# Patient Record
Sex: Female | Born: 1980 | Race: Asian | Hispanic: No | Smoking: Never smoker
Health system: Southern US, Community
[De-identification: ages and names within clinical notes are randomized; demographics above are authoritative.]

## PROBLEM LIST (undated history)

## (undated) ENCOUNTER — Inpatient Hospital Stay (HOSPITAL_COMMUNITY): Payer: Self-pay

## (undated) DIAGNOSIS — E039 Hypothyroidism, unspecified: Secondary | ICD-10-CM

## (undated) DIAGNOSIS — R011 Cardiac murmur, unspecified: Secondary | ICD-10-CM

## (undated) DIAGNOSIS — Z8632 Personal history of gestational diabetes: Secondary | ICD-10-CM

## (undated) HISTORY — PX: THYROIDECTOMY: SHX17

## (undated) HISTORY — DX: Personal history of gestational diabetes: Z86.32

## (undated) HISTORY — DX: Cardiac murmur, unspecified: R01.1

---
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
## 2002-01-31 ENCOUNTER — Encounter: Payer: Self-pay | Admitting: *Deleted

## 2002-01-31 ENCOUNTER — Inpatient Hospital Stay (HOSPITAL_COMMUNITY): Admission: AD | Admit: 2002-01-31 | Discharge: 2002-01-31 | Payer: Self-pay | Admitting: *Deleted

## 2002-02-02 ENCOUNTER — Inpatient Hospital Stay (HOSPITAL_COMMUNITY): Admission: AD | Admit: 2002-02-02 | Discharge: 2002-02-02 | Payer: Self-pay | Admitting: Obstetrics and Gynecology

## 2002-02-12 ENCOUNTER — Inpatient Hospital Stay (HOSPITAL_COMMUNITY): Admission: AD | Admit: 2002-02-12 | Discharge: 2002-02-12 | Payer: Self-pay | Admitting: Obstetrics and Gynecology

## 2007-03-22 ENCOUNTER — Emergency Department (HOSPITAL_COMMUNITY): Admission: EM | Admit: 2007-03-22 | Discharge: 2007-03-22 | Payer: Self-pay | Admitting: Emergency Medicine

## 2008-11-20 ENCOUNTER — Ambulatory Visit (HOSPITAL_COMMUNITY): Admission: RE | Admit: 2008-11-20 | Discharge: 2008-11-20 | Payer: Self-pay | Admitting: Family Medicine

## 2008-12-07 ENCOUNTER — Ambulatory Visit (HOSPITAL_COMMUNITY): Admission: RE | Admit: 2008-12-07 | Discharge: 2008-12-07 | Payer: Self-pay | Admitting: Family Medicine

## 2009-01-01 ENCOUNTER — Ambulatory Visit (HOSPITAL_COMMUNITY): Admission: RE | Admit: 2009-01-01 | Discharge: 2009-01-01 | Payer: Self-pay | Admitting: Family Medicine

## 2009-02-12 ENCOUNTER — Inpatient Hospital Stay (HOSPITAL_COMMUNITY): Admission: AD | Admit: 2009-02-12 | Discharge: 2009-02-14 | Payer: Self-pay | Admitting: Family Medicine

## 2009-02-12 ENCOUNTER — Ambulatory Visit: Payer: Self-pay | Admitting: Obstetrics & Gynecology

## 2009-02-12 ENCOUNTER — Ambulatory Visit: Payer: Self-pay | Admitting: Family

## 2011-03-16 LAB — CBC
HCT: 41.5 % (ref 36.0–46.0)
HCT: 41.7 % (ref 36.0–46.0)
Hemoglobin: 13.4 g/dL (ref 12.0–15.0)
Hemoglobin: 13.7 g/dL (ref 12.0–15.0)
MCHC: 32.3 g/dL (ref 30.0–36.0)
MCV: 82.1 fL (ref 78.0–100.0)
MCV: 82.4 fL (ref 78.0–100.0)
Platelets: 152 10*3/uL (ref 150–400)
RBC: 5.03 MIL/uL (ref 3.87–5.11)
RBC: 5.08 MIL/uL (ref 3.87–5.11)
RDW: 15.8 % — ABNORMAL HIGH (ref 11.5–15.5)
WBC: 10.5 10*3/uL (ref 4.0–10.5)
WBC: 19 10*3/uL — ABNORMAL HIGH (ref 4.0–10.5)

## 2011-04-21 NOTE — Op Note (Signed)
[facility]  Patient:    Theresa Owens, Theresa Owens                             MRN: [id number] Proc. Date: 01/24/01 Attending:  Evalee Mutton. Leeanne Mannan, M.D. CC:         [facility]   Operative Report  PREOPERATIVE DIAGNOSIS:  Right breast lump.  POSTOPERATIVE DIAGNOSIS:  Right breast lump.  PROCEDURE PERFORMED:  Excision biopsy of the right breast lump.  ANESTHESIA:  General laryngeal mask anesthesia.  SURGEON: Evalee Mutton. Leeanne Mannan, M.D.  ASSISTANT:  ______ .  DESCRIPTION OF PROCEDURE:  Patient was brought to the operating room and placed supine on the operating table.  General laryngeal mask anesthesia was given.  The right breast and the surrounding area were cleaned, prepped and draped in usual manner.  The lump was palpated, which appeared to be more than 3 cm in diameter in the right lower quadrant of the right breast.  We decided to make a circumareolar incision, which was marked in the right side of the circumference of right areolar.  The incision was made ______ half circle along the areolar.  The incision was deepened through the deeper tissue with electrocautery.  The mass was pushed under the incision and kept in position by the assistant and the incision was deepened through the subcutaneous tissue and the breast tissue until the lump was reached.  A little blunt dissection with blunt tip hemostat was done to expose the anterior substance of the lump on which a stay suture was taken using 3-0 silk.  Holding the stay suture, the lump was dissected by blunt and sharp dissection around until it was freed from all sides.  Finally, it was delivered out of the incision and the final connection with the breast tissue was divided with the help of electrocautery. After removing the breast lump completely it was removed from the field and sent fresh to pathology for study.  The wound was irrigated with normal saline.   The deeper cavity was  inspected for any oozing or bleeding. Bleeding points were cauterized.  The divided breast tissue was approximated using 4-0 Vicryl interrupted stitches.  Wound was irrigated once again.  About 10 cc of 0.25% Marcaine with epinephrine was infiltrated in and along the incision for postoperative pain control.  The skin was closed with 5-0 Monocryl subcuticular stitch.  Steri-Strips were applied, which were covered with sterile gauze and Tegaderm dressing.  The patient tolerated the procedure very well, which was ______ .  Estimated blood loss was minimal.  Patient was later extubated and transported to the recovery room in good and stable condition. DD:  01/24/01 TD:  01/24/01 Job: [id number]

## 2011-06-12 ENCOUNTER — Other Ambulatory Visit (HOSPITAL_COMMUNITY): Payer: Self-pay | Admitting: Internal Medicine

## 2011-06-12 DIAGNOSIS — C73 Malignant neoplasm of thyroid gland: Secondary | ICD-10-CM

## 2011-07-31 ENCOUNTER — Ambulatory Visit (HOSPITAL_COMMUNITY): Payer: Self-pay

## 2011-08-01 ENCOUNTER — Ambulatory Visit (HOSPITAL_COMMUNITY): Payer: Self-pay

## 2011-08-02 ENCOUNTER — Ambulatory Visit (HOSPITAL_COMMUNITY): Payer: Self-pay

## 2011-08-04 ENCOUNTER — Encounter (HOSPITAL_COMMUNITY): Payer: Self-pay

## 2014-09-21 LAB — OB RESULTS CONSOLE ABO/RH: RH TYPE: POSITIVE

## 2014-09-21 LAB — OB RESULTS CONSOLE RPR: RPR: NONREACTIVE

## 2014-09-21 LAB — OB RESULTS CONSOLE GC/CHLAMYDIA
CHLAMYDIA, DNA PROBE: NEGATIVE
Gonorrhea: NEGATIVE

## 2014-09-21 LAB — OB RESULTS CONSOLE HEPATITIS B SURFACE ANTIGEN: Hepatitis B Surface Ag: NEGATIVE

## 2014-09-21 LAB — OB RESULTS CONSOLE ANTIBODY SCREEN: ANTIBODY SCREEN: NEGATIVE

## 2014-09-21 LAB — OB RESULTS CONSOLE HIV ANTIBODY (ROUTINE TESTING): HIV: NONREACTIVE

## 2014-09-21 LAB — OB RESULTS CONSOLE RUBELLA ANTIBODY, IGM: Rubella: IMMUNE

## 2014-10-19 ENCOUNTER — Other Ambulatory Visit (HOSPITAL_COMMUNITY): Payer: Self-pay | Admitting: Nurse Practitioner

## 2014-10-19 DIAGNOSIS — Z3689 Encounter for other specified antenatal screening: Secondary | ICD-10-CM

## 2014-11-04 ENCOUNTER — Ambulatory Visit (HOSPITAL_COMMUNITY)
Admission: RE | Admit: 2014-11-04 | Discharge: 2014-11-04 | Disposition: A | Discharge status: Home / Self Care | Payer: Medicaid Other | Source: Ambulatory Visit | Attending: Nurse Practitioner | Admitting: Nurse Practitioner

## 2014-11-04 ENCOUNTER — Other Ambulatory Visit (HOSPITAL_COMMUNITY): Payer: Self-pay | Admitting: Nurse Practitioner

## 2014-11-04 DIAGNOSIS — Z3A19 19 weeks gestation of pregnancy: Secondary | ICD-10-CM | POA: Diagnosis not present

## 2014-11-04 DIAGNOSIS — Z36 Encounter for antenatal screening of mother: Secondary | ICD-10-CM | POA: Diagnosis not present

## 2014-11-04 DIAGNOSIS — O0932 Supervision of pregnancy with insufficient antenatal care, second trimester: Secondary | ICD-10-CM | POA: Insufficient documentation

## 2014-11-04 DIAGNOSIS — O26872 Cervical shortening, second trimester: Secondary | ICD-10-CM | POA: Diagnosis present

## 2014-11-04 DIAGNOSIS — O26879 Cervical shortening, unspecified trimester: Secondary | ICD-10-CM

## 2014-11-04 DIAGNOSIS — Z3689 Encounter for other specified antenatal screening: Secondary | ICD-10-CM

## 2014-11-04 DIAGNOSIS — Z1389 Encounter for screening for other disorder: Secondary | ICD-10-CM | POA: Insufficient documentation

## 2014-11-18 ENCOUNTER — Encounter (HOSPITAL_COMMUNITY): Payer: Self-pay

## 2014-11-18 ENCOUNTER — Ambulatory Visit (HOSPITAL_COMMUNITY)
Admission: RE | Admit: 2014-11-18 | Discharge: 2014-11-18 | Disposition: A | Discharge status: Home / Self Care | Payer: Medicaid Other | Source: Ambulatory Visit | Attending: Nurse Practitioner | Admitting: Nurse Practitioner

## 2014-11-18 DIAGNOSIS — O26872 Cervical shortening, second trimester: Secondary | ICD-10-CM | POA: Insufficient documentation

## 2014-11-18 DIAGNOSIS — Z3A21 21 weeks gestation of pregnancy: Secondary | ICD-10-CM | POA: Diagnosis not present

## 2014-11-18 DIAGNOSIS — O0932 Supervision of pregnancy with insufficient antenatal care, second trimester: Secondary | ICD-10-CM | POA: Insufficient documentation

## 2014-11-18 DIAGNOSIS — O99282 Endocrine, nutritional and metabolic diseases complicating pregnancy, second trimester: Secondary | ICD-10-CM | POA: Insufficient documentation

## 2014-11-18 DIAGNOSIS — O0930 Supervision of pregnancy with insufficient antenatal care, unspecified trimester: Secondary | ICD-10-CM | POA: Insufficient documentation

## 2014-11-18 DIAGNOSIS — E039 Hypothyroidism, unspecified: Secondary | ICD-10-CM | POA: Insufficient documentation

## 2014-11-18 DIAGNOSIS — O26879 Cervical shortening, unspecified trimester: Secondary | ICD-10-CM | POA: Insufficient documentation

## 2014-12-02 ENCOUNTER — Encounter (HOSPITAL_COMMUNITY): Payer: Self-pay | Admitting: *Deleted

## 2014-12-02 ENCOUNTER — Ambulatory Visit (HOSPITAL_COMMUNITY)
Admission: RE | Admit: 2014-12-02 | Discharge: 2014-12-02 | Disposition: A | Discharge status: Home / Self Care | Payer: Medicaid Other | Source: Ambulatory Visit | Attending: Nurse Practitioner | Admitting: Nurse Practitioner

## 2014-12-02 ENCOUNTER — Encounter (HOSPITAL_COMMUNITY): Payer: Self-pay

## 2014-12-02 ENCOUNTER — Inpatient Hospital Stay (HOSPITAL_COMMUNITY)
Admission: AD | Admit: 2014-12-02 | Discharge: 2014-12-15 | DRG: 781 | Disposition: A | Discharge status: Home / Self Care | Payer: Medicaid Other | Source: Ambulatory Visit | Attending: Obstetrics & Gynecology | Admitting: Obstetrics & Gynecology

## 2014-12-02 DIAGNOSIS — E039 Hypothyroidism, unspecified: Secondary | ICD-10-CM | POA: Insufficient documentation

## 2014-12-02 DIAGNOSIS — O329XX0 Maternal care for malpresentation of fetus, unspecified, not applicable or unspecified: Secondary | ICD-10-CM | POA: Diagnosis present

## 2014-12-02 DIAGNOSIS — Z3A25 25 weeks gestation of pregnancy: Secondary | ICD-10-CM | POA: Insufficient documentation

## 2014-12-02 DIAGNOSIS — O26872 Cervical shortening, second trimester: Secondary | ICD-10-CM

## 2014-12-02 DIAGNOSIS — O99282 Endocrine, nutritional and metabolic diseases complicating pregnancy, second trimester: Secondary | ICD-10-CM | POA: Insufficient documentation

## 2014-12-02 DIAGNOSIS — Z3A23 23 weeks gestation of pregnancy: Secondary | ICD-10-CM | POA: Diagnosis present

## 2014-12-02 DIAGNOSIS — E890 Postprocedural hypothyroidism: Secondary | ICD-10-CM | POA: Diagnosis present

## 2014-12-02 DIAGNOSIS — O99280 Endocrine, nutritional and metabolic diseases complicating pregnancy, unspecified trimester: Secondary | ICD-10-CM

## 2014-12-02 DIAGNOSIS — O3442 Maternal care for other abnormalities of cervix, second trimester: Secondary | ICD-10-CM

## 2014-12-02 DIAGNOSIS — O0932 Supervision of pregnancy with insufficient antenatal care, second trimester: Secondary | ICD-10-CM

## 2014-12-02 DIAGNOSIS — Z3A24 24 weeks gestation of pregnancy: Secondary | ICD-10-CM | POA: Diagnosis present

## 2014-12-02 DIAGNOSIS — O26879 Cervical shortening, unspecified trimester: Secondary | ICD-10-CM

## 2014-12-02 HISTORY — DX: Hypothyroidism, unspecified: E03.9

## 2014-12-02 LAB — TYPE AND SCREEN
ABO/RH(D): AB POS
Antibody Screen: NEGATIVE

## 2014-12-02 LAB — CBC
HEMATOCRIT: 39 % (ref 36.0–46.0)
HEMOGLOBIN: 13.2 g/dL (ref 12.0–15.0)
MCH: 27.5 pg (ref 26.0–34.0)
MCHC: 33.8 g/dL (ref 30.0–36.0)
MCV: 81.3 fL (ref 78.0–100.0)
Platelets: 181 10*3/uL (ref 150–400)
RBC: 4.8 MIL/uL (ref 3.87–5.11)
RDW: 13.4 % (ref 11.5–15.5)
WBC: 10.5 10*3/uL (ref 4.0–10.5)

## 2014-12-02 LAB — TSH: TSH: 1.025 u[IU]/mL (ref 0.350–4.500)

## 2014-12-02 LAB — ABO/RH: ABO/RH(D): AB POS

## 2014-12-02 MED ORDER — PRENATAL MULTIVITAMIN CH
1.0000 | ORAL_TABLET | Freq: Every day | ORAL | Status: DC
  Administered 2014-12-02 – 2014-12-15 (×14): 1 via ORAL
  Filled 2014-12-02 (×11): qty 1
  Filled 2014-12-02: qty 10
  Filled 2014-12-02 (×2): qty 1

## 2014-12-02 MED ORDER — ZOLPIDEM TARTRATE 5 MG PO TABS: 5.0000 mg | ORAL_TABLET | Freq: Every evening | ORAL | Status: DC | PRN

## 2014-12-02 MED ORDER — DOCUSATE SODIUM 100 MG PO CAPS
100.0000 mg | ORAL_CAPSULE | Freq: Every day | ORAL | Status: DC
  Administered 2014-12-02 – 2014-12-15 (×13): 100 mg via ORAL
  Filled 2014-12-02 (×13): qty 1

## 2014-12-02 MED ORDER — CALCIUM CARBONATE ANTACID 500 MG PO CHEW: 2.0000 | CHEWABLE_TABLET | ORAL | Status: DC | PRN

## 2014-12-02 MED ORDER — LEVOTHYROXINE SODIUM 100 MCG PO TABS
100.0000 ug | ORAL_TABLET | Freq: Every day | ORAL | Status: DC
Start: 2014-12-03 — End: 2014-12-15
  Administered 2014-12-03 – 2014-12-15 (×13): 100 ug via ORAL
  Filled 2014-12-02 (×14): qty 1

## 2014-12-02 MED ORDER — PROGESTERONE MICRONIZED 200 MG PO CAPS
200.0000 mg | ORAL_CAPSULE | Freq: Every day | ORAL | Status: DC
  Administered 2014-12-02 – 2014-12-05 (×4): 200 mg via VAGINAL

## 2014-12-02 MED ORDER — ACETAMINOPHEN 325 MG PO TABS: 650.0000 mg | ORAL_TABLET | ORAL | Status: DC | PRN

## 2014-12-02 MED ORDER — PROGESTERONE MICRONIZED 200 MG PO CAPS
200.0000 mg | ORAL_CAPSULE | Freq: Every day | ORAL | Status: DC
  Administered 2014-12-06 – 2014-12-14 (×9): 200 mg via VAGINAL
  Filled 2014-12-02 (×13): qty 1

## 2014-12-02 MED ORDER — BETAMETHASONE SOD PHOS & ACET 6 (3-3) MG/ML IJ SUSP
12.0000 mg | INTRAMUSCULAR | Status: AC
Start: 2014-12-02 — End: 2014-12-03
  Administered 2014-12-02 – 2014-12-03 (×2): 12 mg via INTRAMUSCULAR
  Filled 2014-12-02 (×2): qty 2

## 2014-12-02 NOTE — H&P (Signed)
Theresa Owens is a 33 y.o. female  [phone] at [redacted]w[redacted]d admitted for progressive cervical shortening  Maternal Medical History:  Reason for admission: Nausea. She was first noted to have short cx 2.2cm on 19 wk [place] and has been on vaginal progesterone since that time. Today cx measures 0.2cm with funneling.  Contractions: none  Fetal activity: Perceived fetal activity is normal.   Last perceived fetal movement was within the past hour.    Prenatal complications: See above. Also has hypothyroidism, on Synthroid. TFTs normal 09/21/14.  Prenatal Complications - Diabetes: none.    OB History    Gravida Para Term Preterm AB TAB SAB Ectopic Multiple Living   4 2 2  0 1 0 1 0 0 2     Past Medical History  Diagnosis Date  . Vaginal Pap smear, abnormal   . Hypothyroidism   . Cancer    Past Surgical History  Procedure Laterality Date  . Thyroidectomy N/A     2006   Family History: family history includes Hypertension in her mother. Social History:  reports that she has never smoked. She does not have any smokeless tobacco history on file. She reports that she does not drink alcohol or use illicit drugs.   Prenatal Transfer Tool  Maternal Diabetes: No Genetic Screening: Normal Maternal Ultrasounds/Referrals: Abnormal:  Findings:   Other: short cx Fetal Ultrasounds or other Referrals:  None Maternal Substance Abuse:  No Significant Maternal Medications:  Meds include: Progesterone Syntroid Significant Maternal Lab Results:  None Other Comments:  None  Review of Systems  Constitutional: Negative for fever and weight loss.  Respiratory: Negative for cough.   Cardiovascular: Negative for palpitations.  Gastrointestinal: Negative for heartburn, nausea, vomiting, abdominal pain, diarrhea and constipation.  Genitourinary: Negative for dysuria and flank pain.  Neurological: Negative for headaches.  Psychiatric/Behavioral: Negative for depression. The patient is not nervous/anxious.        Temperature 98.2 F (36.8 C), height 4\' 11"  (1.499 m), last menstrual period 06/18/2014. Maternal Exam:  Uterine Assessment: none  Abdomen: Estimated fetal weight is 57th%ile by [place] today.   Fetal presentation: breech     Fetal Exam Fetal Monitor Review: Mode: ultrasound.   Baseline rate: 135-140.  Variability: moderate (6-25 bpm).   Pattern: no decelerations and accelerations present.    Fetal State Assessment: Category I - tracings are normal.     Physical Exam  Nursing note and vitals reviewed. Constitutional: She is oriented to person, place, and time. She appears well-developed and well-nourished.  Short stature  HENT:  Head: Normocephalic.  Eyes: Pupils are equal, round, and reactive to light.  Neck: Normal range of motion. Neck supple. No thyromegaly present.  Cardiovascular: Normal rate, regular rhythm and normal heart sounds.   Respiratory: Effort normal and breath sounds normal.  GI: Soft. There is no tenderness.  Genitourinary:  Not examined  Musculoskeletal: Normal range of motion.  Neurological: She is alert and oriented to person, place, and time. She has normal reflexes.  Skin: Skin is warm and dry.  Psychiatric: She has a normal mood and affect. Her behavior is normal. Judgment and thought content normal.    Prenatal labs: ABO, Rh: AB/Positive/-- (10/19 0000) Antibody: Negative (10/19 0000) Rubella: Immune (10/19 0000) RPR: Nonreactive (10/19 0000)  HBsAg: Negative (10/19 0000)  HIV: Non-reactive (10/19 0000)  GBS:     Assessment/Plan: 33 yo Asian female [id number] at [redacted]w[redacted]d  Cervical shortening>BMZ, prometrium, NICU consult Hypothyroism> continue Synthroid  POE,Theresa Owens 12/02/2014, 12:59 PM

## 2014-12-03 NOTE — Progress Notes (Signed)
Patient ID: Theresa Owens, female   DOB: [birth year], 33 y.o.   MRN: [id number] [place] COMPREHENSIVE PROGRESS NOTE  Theresa Owens is a 33 y.o. [id number] at [redacted]w[redacted]d  who is admitted for cervical insufficiency.   Fetal presentation is breech. Length of Stay:  1  Days  Subjective: Pt denies pain or ctx Patient reports good fetal movement.  She reports no bleeding and no loss of fluid per vagina.  Vitals:  Blood pressure 99/56, pulse 84, temperature 98.2 F (36.8 C), temperature source Oral, resp. rate 16, height 4\' 11"  (1.499 m), weight 129 lb 4 oz (58.627 kg), last menstrual period 06/18/2014. Physical Examination: General appearance - alert, well appearing, and in no distress Abdomen - soft, nontender, nondistended, no masses or organomegaly gravid Extremities - no pedal edema noted Cervical Exam: Not evaluated. Membranes:intact  Fetal Monitoring:  Baseline: 120's bpm, Variability: Fair (1-6 bpm), Accelerations: Non-reactive but appropriate for gestational age and Decelerations: Absent  Labs:  Results for orders placed or performed during the hospital encounter of 12/02/14 (from the past 24 hour(s))  CBC on admission   Collection Time: 12/02/14  1:45 PM  Result Value Ref Range   WBC 10.5 4.0 - 10.5 K/uL   RBC 4.80 3.87 - 5.11 MIL/uL   Hemoglobin 13.2 12.0 - 15.0 g/dL   HCT 39.0 36.0 - 46.0 %   MCV 81.3 78.0 - 100.0 fL   MCH 27.5 26.0 - 34.0 pg   MCHC 33.8 30.0 - 36.0 g/dL   RDW 13.4 11.5 - 15.5 %   Platelets 181 150 - 400 K/uL  TSH   Collection Time: 12/02/14  1:45 PM  Result Value Ref Range   TSH 1.025 0.350 - 4.500 uIU/mL  Type and screen   Collection Time: 12/02/14  1:45 PM  Result Value Ref Range   ABO/RH(D) AB POS    Antibody Screen NEG    Sample Expiration 12/05/2014   ABO/Rh   Collection Time: 12/02/14  1:45 PM  Result Value Ref Range   ABO/RH(D) AB POS     Imaging Studies:    12/02/2014 CL 0.2cm EFW 57th%ile; breech   Medications:  Scheduled .  betamethasone acetate-betamethasone sodium phosphate  12 mg Intramuscular Q24H  . docusate sodium  100 mg Oral Daily  . levothyroxine  100 mcg Oral QAC breakfast  . prenatal multivitamin  1 tablet Oral Q1200  . progesterone  200 mg Vaginal QHS  . progesterone  200 mg Vaginal QHS   I have reviewed the patient's current medications.  ASSESSMENT: Patient Active Problem List   Diagnosis Date Noted  . Cervical shortening affecting pregnancy in second trimester 12/02/2014  . [redacted] weeks gestation of pregnancy   . Hypothyroid in pregnancy, antepartum   . Insufficient prenatal care   . Short cervix affecting pregnancy   . [redacted] weeks gestation of pregnancy   . [redacted] weeks gestation of pregnancy   . Encounter for fetal anatomic survey   . Encounter for routine screening for malformation using ultrasonics   . Short cervix in second trimester, antepartum     PLAN: Keep Prometrium 2nd dose of BMZ due today Watch for s/sx of progressive cervical change Continue routine antenatal care.   HARRAWAY-SMITH, Renner Sebald 12/03/2014,7:15 AM

## 2014-12-03 NOTE — Consult Note (Signed)
[place] 12/03/2014    11:57 PM  Neonatal Medicine Consultation         Theresa Owens          MRN:  [id number]  I was called at the request of the patient's obstetrician (Dr. Gala Romney) to speak to this patient due to potential preterm delivery.  The patient's prenatal course includes shortened cervix.  She is 24 weeks currently.  She is admitted to antenatal unit, and is receiving treatment that includes betamethasone course.  The baby is female.  I reviewed expectations for a baby born prematurely, including survival, length of stay, morbidities such as respiratory distress, IVH, infection, feeding intolerance, retinopathy.  I described how we provide respiratory and feeding support.  Mom plans to breast feed, which I encouraged as best for the baby (with supplementations for needed calories).  I let mom know that the baby's outlook generally improves the longer she remains undelivered.  I spent 20 minutes reviewing the record, speaking to the patient, and entering appropriate documentation.  More than 50% of the time was spent face to face with patient.   _____________________ Electronically Signed By: Roosevelt Locks, MD Neonatologist

## 2014-12-04 ENCOUNTER — Encounter (HOSPITAL_COMMUNITY): Payer: Self-pay

## 2014-12-04 NOTE — L&D Delivery Note (Signed)
Patient is 34 y.o. 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  APGAR: 9, 9; weight 7 lb 12.5 oz (3530 g).   Placenta status: Intact, Spontaneous.  Cord: 3 vessels with the following complications: None.  Anesthesia:  none Episiotomy:  none Lacerations:  2nd degree Suture Repair: 3.0 vicryl rapide Est. Blood Loss (mL):  139mL  Mom to postpartum.  Baby to Couplet care / Skin to Skin.  Theresa Owens ROCIO 03/25/2015, 2:48 PM

## 2014-12-04 NOTE — Progress Notes (Signed)
Patient ID: Theresa Owens, female   DOB: 09-26-1981, 34 y.o.   MRN: [id number] [place] COMPREHENSIVE PROGRESS NOTE  Theresa Owens is a 34 y.o. [id number] at [redacted]w[redacted]d  who is admitted for cervical insufficiency.   Fetal presentation is breech. Length of Stay:  2  Days  Subjective: Pt denies pain or ctx Patient reports good fetal movement.  She reports no bleeding and no loss of fluid per vagina.  Vitals:  Blood pressure 104/50, pulse 75, temperature 98.2 F (36.8 C), temperature source Oral, resp. rate 18, height 4\' 11"  (1.499 m), weight 129 lb 4 oz (58.627 kg), last menstrual period 06/18/2014, SpO2 98 %. Physical Examination: General appearance - alert, well appearing, and in no distress Abdomen - soft, nontender, gravid Extremities - no pedal edema noted Cervical Exam: Not evaluated. Membranes:intact  Fetal Monitoring:  Baseline: 150 bpm, Variability: Fair (1-6 bpm), Accelerations: Non-reactive but appropriate for gestational age, Decelerations: Absent and Toco: no contractions  Labs:  No results found for this or any previous visit (from the past 24 hour(s)).  Imaging Studies:    12/02/2014 CL 0.2cm EFW 57th%ile; breech   Medications:  Scheduled . docusate sodium  100 mg Oral Daily  . levothyroxine  100 mcg Oral QAC breakfast  . prenatal multivitamin  1 tablet Oral Q1200  . progesterone  200 mg Vaginal QHS  . progesterone  200 mg Vaginal QHS   I have reviewed the patient's current medications.  ASSESSMENT: Patient Active Problem List   Diagnosis Date Noted  . Cervical shortening affecting pregnancy in second trimester 12/02/2014  . [redacted] weeks gestation of pregnancy   . Hypothyroid in pregnancy, antepartum   . Insufficient prenatal care   . Short cervix affecting pregnancy   . [redacted] weeks gestation of pregnancy   . [redacted] weeks gestation of pregnancy   . Encounter for fetal anatomic survey   . Encounter for routine screening for malformation using ultrasonics   . Short  cervix in second trimester, antepartum     PLAN: Continue Prometrium and in house observation Watch for s/sx of preterm labor Will consult with MFM regarding length of stay Continue routine antenatal care.   Theresa Owens 12/04/2014,7:22 AM

## 2014-12-05 NOTE — Progress Notes (Signed)
Patient ID: Theresa Owens, female   DOB: 1981-07-12, 34 y.o.   MRN: [id number] [place]) NOTE  Theresa Owens is a 34 y.o. [id number] at [redacted]w[redacted]d  who is admitted for short cervix, 2 mm thick with funneling.   Fetal presentation is BREECH Length of Stay:  3  Days  Subjective: Pt stable without contractions Patient reports the fetal movement as active. Patient reports uterine contraction  activity as none. Patient reports  vaginal bleeding as none. Patient describes fluid per vagina as None.  Vitals:  Blood pressure 104/56, pulse 77, temperature 98.5 F (36.9 C), temperature source Oral, resp. rate 18, height 4\' 11"  (1.499 m), weight 58.627 kg (129 lb 4 oz), last menstrual period 06/18/2014, SpO2 98 %. Physical Examination:  General appearance - alert, well appearing, and in no distress Heart - normal rate and regular rhythm Abdomen - soft, nontender, nondistended Fundal Height:  size equals dates Cervical Exam: not done and fetal presentation is breech. Extremities: extremities normal, atraumatic, no cyanosis or edema and Homans sign is negative, no sign of DVT with DTRs 2+ bilaterally Membranes:intact  Fetal Monitoring:  To be done this a.m.  Labs:  No results found for this or any previous visit (from the past 24 hour(s)).  Imaging Studies:     Currently EPIC will not allow sonographic studies to automatically populate into notes.  In the meantime, copy and paste results into note or free text.  Medications:  Scheduled . docusate sodium  100 mg Oral Daily  . levothyroxine  100 mcg Oral QAC breakfast  . prenatal multivitamin  1 tablet Oral Q1200  . progesterone  200 mg Vaginal QHS  . progesterone  200 mg Vaginal QHS   I have reviewed the patient's current medications.  ASSESSMENT: Patient Active Problem List   Diagnosis Date Noted  . Cervical shortening affecting pregnancy in second trimester 12/02/2014  . [redacted] weeks gestation of pregnancy   . Hypothyroid  in pregnancy, antepartum   . Insufficient prenatal care   . Short cervix affecting pregnancy   . [redacted] weeks gestation of pregnancy   . [redacted] weeks gestation of pregnancy   . Encounter for fetal anatomic survey   . Encounter for routine screening for malformation using ultrasonics   . Short cervix in second trimester, antepartum     PLAN:Continue Prometrium and in house observation Watch for s/sx of preterm labor Will consult with MFM regarding length of stay Continue routine antenatal care.     Azya Barbero V 12/05/2014,7:25 AM

## 2014-12-06 NOTE — Progress Notes (Signed)
Patient ID: Theresa Owens, female   DOB: June 15, 1981, 34 y.o.   MRN: [id number] [place]) NOTE  Theresa Owens is a 34 y.o. [id number] at [redacted]w[redacted]d  who is admitted for short cervix, 2 mm thick with funneling.   Fetal presentation is BREECH Length of Stay:  4  Days  Subjective: Pt stable without contractions Patient reports the fetal movement as active. Patient reports uterine contraction  activity as none. Patient reports  vaginal bleeding as none. Patient describes fluid per vagina as None.  Vitals:  Blood pressure 103/56, pulse 75, temperature 98.2 F (36.8 C), temperature source Oral, resp. rate 18, height 4\' 11"  (1.499 m), weight 129 lb 4 oz (58.627 kg), last menstrual period 06/18/2014, SpO2 98 %. Physical Examination:  General appearance - alert, well appearing, and in no distress Heart - normal rate and regular rhythm Abdomen - soft, nontender, gravid Fundal Height:  size equals dates Cervical Exam: not done and fetal presentation is breech. Extremities: extremities normal, atraumatic, no cyanosis or edema and Homans sign is negative, no sign of DVT with DTRs 2+ bilaterally Membranes:intact  Fetal Monitoring:  Baseline 145, mod variability, no decelerations, non-reactive but appropriate for gestational age Toco: no contractions  Labs:  Results for orders placed or performed during the hospital encounter of 12/02/14 (from the past 24 hour(s))  Type and screen   Collection Time: 12/05/14  1:22 PM  Result Value Ref Range   ABO/RH(D) AB POS    Antibody Screen NEG    Sample Expiration 12/08/2014     Imaging Studies:     Currently EPIC will not allow sonographic studies to automatically populate into notes.  In the meantime, copy and paste results into note or free text.  Medications:  Scheduled . docusate sodium  100 mg Oral Daily  . levothyroxine  100 mcg Oral QAC breakfast  . prenatal multivitamin  1 tablet Oral Q1200  . progesterone  200 mg Vaginal QHS    I have reviewed the patient's current medications.  ASSESSMENT: Patient Active Problem List   Diagnosis Date Noted  . Cervical shortening affecting pregnancy in second trimester 12/02/2014  . [redacted] weeks gestation of pregnancy   . Hypothyroid in pregnancy, antepartum   . Insufficient prenatal care   . Short cervix affecting pregnancy   . [redacted] weeks gestation of pregnancy   . [redacted] weeks gestation of pregnancy   . Encounter for fetal anatomic survey   . Encounter for routine screening for malformation using ultrasonics   . Short cervix in second trimester, antepartum     PLAN: Continue Prometrium and in house observation Watch for s/sx of preterm labor Will consult with MFM regarding length of stay Continue routine antenatal care.     Theresa Owens 12/06/2014,6:59 AM

## 2014-12-07 ENCOUNTER — Inpatient Hospital Stay (HOSPITAL_COMMUNITY): Payer: Medicaid Other

## 2014-12-07 DIAGNOSIS — Z3A24 24 weeks gestation of pregnancy: Secondary | ICD-10-CM | POA: Diagnosis present

## 2014-12-07 NOTE — Progress Notes (Signed)
Ur chart review completed.  

## 2014-12-07 NOTE — Progress Notes (Signed)
Patient ID: Theresa Owens, female   DOB: June 10, 1981, 34 y.o.   MRN: [id number] [place] COMPREHENSIVE PROGRESS NOTE  Theresa Owens is a 34 y.o. [id number] at [redacted]w[redacted]d  who is admitted for shortened cervix.   Fetal presentation is unsure. Length of Stay:  5  Days  Subjective: Pt with no complaints.  +FM, NO VB, No ctx, No LOF   Vitals:  Blood pressure 110/63, pulse 79, temperature 98 F (36.7 C), temperature source Oral, resp. rate 20, height 4\' 11"  (1.499 m), weight 129 lb 4 oz (58.627 kg), last menstrual period 06/18/2014, SpO2 98 %. Physical Examination: General appearance - alert, well appearing, and in no distress Abdomen - soft, nontender, nondistended, no masses or organomegaly gravid Extremities - peripheral pulses normal, no pedal edema, no clubbing or cyanosis, no pedal edema noted Cervical Exam: Not evaluated.  Membranes:intact  Fetal Monitoring:  Baseline: 140-150's bpm, Variability: Good {> 6 bpm), Accelerations: Non-reactive but appropriate for gestational age, Decelerations: Absent and TOCO: no ctx  Labs:  No results found for this or any previous visit (from the past 24 hour(s)).  Imaging Studies:    sono pending for today to recheck cervical length.     Medications:  Scheduled . docusate sodium  100 mg Oral Daily  . levothyroxine  100 mcg Oral QAC breakfast  . prenatal multivitamin  1 tablet Oral Q1200  . progesterone  200 mg Vaginal QHS   I have reviewed the patient's current medications.  ASSESSMENT: Patient Active Problem List   Diagnosis Date Noted  . Cervical shortening affecting pregnancy in second trimester 12/02/2014  . [redacted] weeks gestation of pregnancy   . Hypothyroid in pregnancy, antepartum   . Insufficient prenatal care   . Short cervix affecting pregnancy   . [redacted] weeks gestation of pregnancy   . [redacted] weeks gestation of pregnancy   . Encounter for fetal anatomic survey   . Encounter for routine screening for malformation using ultrasonics   .  Short cervix in second trimester, antepartum     PLAN: sono today to recheck cervical length and determine discharge planning Continue routine antenatal care.   HARRAWAY-SMITH, Edna Grover 12/07/2014,7:21 AM

## 2014-12-08 ENCOUNTER — Inpatient Hospital Stay (HOSPITAL_COMMUNITY): Payer: Medicaid Other

## 2014-12-08 ENCOUNTER — Encounter (HOSPITAL_COMMUNITY): Payer: Medicaid Other

## 2014-12-08 LAB — TYPE AND SCREEN
ABO/RH(D): AB POS
Antibody Screen: NEGATIVE

## 2014-12-08 NOTE — Progress Notes (Signed)
[place] CONSULT  Patient Name: Theresa Owens Medical Record Number: [id number] Date of Birth: 16-Sep-1981 Requesting Physician Name: Guss Bunde, MD Date of Service: 12/08/2014  Chief Complaint Shortened cervix  History of Present Illness Theresa Owens was seen in consultation today secondary to a shortened cervix (3.5 mm) at the request of Dr. Harolyn Rutherford. The patient is a 34 y.o. [id number],SK [redacted]w[redacted]d with an EDD of 03/25/2015, by Last Menstrual Period dating method. Theresa Owens was noted to have a short cervix on 12/30. She has since had a course of betamethasone and has remained stable. She denies any contractions, loss of fluid, vaginal bleeding. Reports fetal movement.  Review of Systems Pertinent items are noted in HPI.  Patient History OB History  Gravida Para Term Preterm AB SAB TAB Ectopic Multiple Living  4 2 2  0 1 1 0 0 0 2    # Outcome Date GA Lbr Len/2nd Weight Sex Delivery Anes PTL Lv  4 Current           3 SAB           2 Term           1 Term               Past Medical History  Diagnosis Date  . Vaginal Pap smear, abnormal   . Hypothyroidism   . Cancer     Past Surgical History  Procedure Laterality Date  . Thyroidectomy N/A     2006    History   Social History  . Marital Status: Single    Spouse Name: N/A    Number of Children: N/A  . Years of Education: N/A   Social History Main Topics  . Smoking status: Never Smoker   . Smokeless tobacco: None  . Alcohol Use: No  . Drug Use: No  . Sexual Activity: Yes   Other Topics Concern  . None   Social History Narrative    Family History  Problem Relation Age of Onset  . Hypertension Mother    In addition, the patient has no family history of mental retardation, birth defects, or genetic diseases.  Physical Examination Filed  Vitals:   12/08/14 1107  BP: 116/71  Pulse: 106  Temp: 98.4 F (36.9 C)  Resp: 18   General appearance - alert, well appearing, and in no distress SSE: Cervix visually closed. No membranes noted coming from os.  SVE: Cervix closed. Cup pessary placed without difficulty with cervix seated in the smaller hole and posterior portion of the pessary seated in the posterior fornix.  Assessment and Recommendations 1. Shortened cervix at [redacted]w[redacted]d- Theresa Owens was counseled that she is past the gestation where cerclage would be an option but that in addition to the prometrium she is currently receiving a pessary would be an option. She was counseled that pessaries have been studied and have shown promise in the setting of short cervices but that at this time there is limited evidence on their efficacy.   The pessary should be removed if there is concern for preterm labor or rupture of membranes. Otherwise the pessary should remain in place until [redacted] weeks gestation.  I have relayed the above to Dr. Harolyn Rutherford and am happy to see Theresa Owens again should further concerns arise. Thank you for allowing me to participate in her care.    Cheryle Dark, MD           60 minutes was spent with the patient, >50% of  which was counseling on the above.

## 2014-12-08 NOTE — Progress Notes (Signed)
Patient ID: Theresa Owens, female   DOB: 18-Apr-1981, 34 y.o.   MRN: [id number] [place] COMPREHENSIVE PROGRESS NOTE  Rosaura Bolon is a 34 y.o. [id number] at [redacted]w[redacted]d  Who was admitted for shortened cervix.    Fetal presentation is breech as of 12/07/14 ultrasound; also showed 3.5 mm cervical length.  Length of Stay:  6  Days  Subjective: Patient with no complaints.  +FM, No VB, No ctx, No LOF  Vitals:  Blood pressure 108/66, pulse 87, temperature 98.1 F (36.7 C), temperature source Oral, resp. rate 18, height 4\' 11"  (1.499 m), weight 129 lb 4 oz (58.627 kg), last menstrual period 06/18/2014, SpO2 98 %. Physical Examination: General appearance - alert, well appearing, and in no distress Abdomen - soft, gravid, nontender, nondistended Extremities - peripheral pulses normal, no pedal edema, no clubbing or cyanosis, no pedal edema noted Cervical Exam - Not evaluated.  Membranes - intact  Fetal Monitoring:  Baseline: 140-150's bpm, Variability: Good {> 6 bpm), Accelerations: Non-reactive but appropriate for gestational age, Decelerations: Absent and TOCO: no ctx   Medications:  Scheduled . docusate sodium  100 mg Oral Daily  . levothyroxine  100 mcg Oral QAC breakfast  . prenatal multivitamin  1 tablet Oral Q1200  . progesterone  200 mg Vaginal QHS   I have reviewed the patient's current medications.  ASSESSMENT: Patient Active Problem List   Diagnosis Date Noted  . [redacted] weeks gestation of pregnancy   . Cervical shortening affecting pregnancy in second trimester 12/02/2014  . Hypothyroid in pregnancy, antepartum   . Insufficient prenatal care     PLAN: MFM consulted regarding evaluation for possible pessary placement Continue inpatient surveillance for now and routine antenatal care  Osborne Oman, MD 12/08/2014,10:37 AM

## 2014-12-08 NOTE — Consult Note (Signed)
[facility] CONSULT  Patient Name: Theresa Owens Medical Record Number:  [id number] Date of Birth: [birth year] Requesting Physician Name:  Guss Bunde, MD Date of Service: 12/08/2014  Chief Complaint Shortened cervix  History of Present Illness Victorious Cosio was seen in consultation today secondary to a shortened cervix (3.5 mm) at the request of Dr. Harolyn Rutherford.  The patient is a 34 y.o. [id number],XH [redacted]w[redacted]d with an EDD of 03/25/2015, by Last Menstrual Period dating method.  Theresa Owens was noted to have a short cervix on 12/30.  She has since had a course of betamethasone and has remained stable.  She denies any contractions, loss of fluid, vaginal bleeding.  Reports fetal movement.  Review of Systems Pertinent items are noted in HPI.  Patient History OB History  Gravida Para Term Preterm AB SAB TAB Ectopic Multiple Living  4 2 2  0 1 1 0 0 0 2    # Outcome Date GA Lbr Len/2nd Weight Sex Delivery Anes PTL Lv  4 Current           3 SAB           2 Term           1 Term               Past Medical History  Diagnosis Date  . Vaginal Pap smear, abnormal   . Hypothyroidism   . Cancer     Past Surgical History  Procedure Laterality Date  . Thyroidectomy N/A     2006    History   Social History  . Marital Status: Single    Spouse Name: N/A    Number of Children: N/A  . Years of Education: N/A   Social History Main Topics  . Smoking status: Never Smoker   . Smokeless tobacco: None  . Alcohol Use: No  . Drug Use: No  . Sexual Activity: Yes   Other Topics Concern  . None   Social History Narrative    Family History  Problem Relation Age of Onset  . Hypertension Mother    In addition, the patient has no family history of mental retardation, birth defects, or genetic diseases.  Physical Examination Filed Vitals:   12/08/14 1107  BP: 116/71  Pulse: 106  Temp: 98.4 F (36.9 C)  Resp: 18   General appearance - alert, well appearing, and in no distress SSE: Cervix  visually closed.  No membranes noted coming from os.  SVE: Cervix closed.  Cup pessary placed without difficulty with cervix seated in the smaller hole and posterior portion of the pessary seated in the posterior fornix.  Assessment and Recommendations 1. Shortened cervix at [redacted]w[redacted]d- Ms. Hutt was counseled that she is past the gestation where cerclage would be an option but that in addition to the prometrium she is currently receiving a pessary would be an option.  She was counseled that pessaries have been studied and have shown promise in the setting of short cervices but that at this time there is limited evidence on their efficacy.    The pessary should be removed if there is concern for preterm labor or rupture of membranes.  Otherwise the pessary should remain in place until [redacted] weeks gestation.     I have relayed the above to Dr. Harolyn Rutherford and am happy to see Ms. Kirchman again should further concerns arise.  Thank you for allowing me to participate in her care.     George Hugh, MD

## 2014-12-09 NOTE — Progress Notes (Signed)
Patient ID: Theresa Owens, female   DOB: 16-Dec-1980, 34 y.o.   MRN: [id number] [place] COMPREHENSIVE PROGRESS NOTE  Theresa Owens is a 34 y.o. [id number] at [redacted]w[redacted]d  who was admitted for shortened cervix.  Pessary placed by MFM on 12/08/14.  Fetal presentation is breech as of 12/07/14 ultrasound; also showed 3.5 mm cervical length.  Length of Stay:  7  Days  Subjective: Patient with no complaints.  +FM, No VB, No ctx, No LOF  Vitals:  Blood pressure 103/62, pulse 80, temperature 98.1 F (36.7 C), temperature source Oral, resp. rate 18, height 4\' 11"  (1.499 m), weight 129 lb 4 oz (58.627 kg), last menstrual period 06/18/2014, SpO2 98 %. Physical Examination: General appearance - alert, well appearing, and in no distress Abdomen - soft, gravid, nontender, nondistended Extremities - peripheral pulses normal, no pedal edema, no clubbing or cyanosis, no pedal edema noted Cervical Exam - Not evaluated.  Membranes - intact  Fetal Monitoring:  Baseline: 140-150's bpm, Variability: Good {> 6 bpm), Accelerations: Non-reactive but appropriate for gestational age, Decelerations: Absent and TOCO: no ctx   Medications:  Scheduled . docusate sodium  100 mg Oral Daily  . levothyroxine  100 mcg Oral QAC breakfast  . prenatal multivitamin  1 tablet Oral Q1200  . progesterone  200 mg Vaginal QHS   I have reviewed the patient's current medications.  ASSESSMENT: Patient Active Problem List   Diagnosis Date Noted  . [redacted] weeks gestation of pregnancy   . Cervical shortening affecting pregnancy in second trimester 12/02/2014  . Hypothyroid in pregnancy, antepartum   . Insufficient prenatal care     PLAN: Continue Prometrium; pessary in place (appreciate MFM help with this patient). As per MFM, the pessary should be removed if there is concern for preterm labor or rupture of membranes. Otherwise the pessary should remain in place until [redacted] weeks gestation. Will follow up next cervical length scan on  12/14/14 and determine eligibility for possible discharge/outpatient management at that time. Continue inpatient surveillance for now and routine antenatal care  Osborne Oman, MD 12/09/2014,7:32 AM

## 2014-12-10 NOTE — Progress Notes (Signed)
Antenatal Nutrition Assessment:  Currently  25 0/[redacted] weeks gestation, with short cervix. Height  59 "  Weight 128 lbs  pre-pregnancy weight unknown .   BMI 25.8  IBW 90-100 lbs Total weight gain --.lbs Weight gain goals 25-35 lbs Estimated needs: 1700-1900 kcal/day, 72-82 grams protein/day, 2 liters fluid/day  Regular diet tolerated well, appetite good. Provide snack and retail menus Current diet prescription will provide for increased needs.  No abnormal nutrition related labs  Nutrition Dx: Increased nutrient needs r/t pregnancy and fetal growth requirements aeb [redacted] weeks gestation.  No educational needs assessed at this time.  Weyman Rodney M.Fredderick Severance LDN Neonatal Nutrition Support Specialist/RD III Pager [phone]

## 2014-12-10 NOTE — Progress Notes (Signed)
Patient ID: Theresa Owens, female   DOB: 05/30/1981, 34 y.o.   MRN: [id number] Patient ID: Theresa Owens, female   DOB: Aug 20, 1981, 34 y.o.   MRN: [id number] [place] COMPREHENSIVE PROGRESS NOTE  Theresa Owens is a 34 y.o. [id number] at [redacted]w[redacted]d   who was admitted for shortened cervix.  Pessary placed by MFM on 12/08/14.  Fetal presentation is breech as of 12/07/14 ultrasound; also showed 3.5 mm cervical length.  Length of Stay:  8  Days  Subjective: Patient with no complaints.  +FM, No VB, No ctx, No LOF  Vitals:  Blood pressure 99/58, pulse 92, temperature 98.1 F (36.7 C), temperature source Oral, resp. rate 16, height 4\' 11"  (1.499 m), weight 128 lb 4.8 oz (58.196 kg), last menstrual period 06/18/2014, SpO2 98 %. Physical Examination: General appearance - alert, well appearing, and in no distress Abdomen - soft, gravid, nontender, nondistended Extremities - peripheral pulses normal, no pedal edema, no clubbing or cyanosis, no pedal edema noted Cervical Exam - Not evaluated.  Membranes - intact  Fetal Monitoring:  Baseline: 140-150's bpm, Variability: Good {> 6 bpm), Accelerations: Non-reactive but appropriate for gestational age, Decelerations: Absent and TOCO: no ctx   Medications:  Scheduled . docusate sodium  100 mg Oral Daily  . levothyroxine  100 mcg Oral QAC breakfast  . prenatal multivitamin  1 tablet Oral Q1200  . progesterone  200 mg Vaginal QHS   I have reviewed the patient's current medications.  ASSESSMENT: [redacted]w[redacted]d Estimated Date of Delivery: 03/25/15  Patient Active Problem List   Diagnosis Date Noted  . [redacted] weeks gestation of pregnancy   . Cervical shortening affecting pregnancy in second trimester 12/02/2014  . Hypothyroid in pregnancy, antepartum   . Insufficient prenatal care     PLAN: Continue Prometrium; pessary in place (appreciate MFM help with this patient). As per MFM, the pessary should be removed if there is concern for preterm labor or rupture of  membranes. Otherwise the pessary should remain in place until [redacted] weeks gestation. Will follow up next cervical length scan on 12/14/14 and determine eligibility for possible discharge/outpatient management at that time. Continue inpatient surveillance for now and routine antenatal care  Florian Buff, MD 12/10/2014,8:02 AM

## 2014-12-10 NOTE — Progress Notes (Signed)
Pt. Called out and complained of leaking fluid after urinating. Pt given pad to wear to monitor leaking and reports no additional leaking or bleeding at this time.

## 2014-12-11 LAB — TYPE AND SCREEN
ABO/RH(D): AB POS
ABO/RH(D): AB POS
Antibody Screen: NEGATIVE
Antibody Screen: NEGATIVE

## 2014-12-11 LAB — AMNISURE RUPTURE OF MEMBRANE (ROM) NOT AT ARMC: AMNISURE: NEGATIVE

## 2014-12-11 NOTE — Progress Notes (Signed)
Patient ID: Theresa Owens, female   DOB: Jul 27, 1981, 34 y.o.   MRN: [id number] [place]) NOTE  Theresa Owens is a 34 y.o. [phone] at [redacted]w[redacted]d  who is admitted for short cervix (3 mm on admission) Length of Stay:  9  Days  Subjective: Patient reports the fetal movement as active. Patient reports uterine contraction  activity as none. Patient reports  vaginal bleeding as none. Patient describes fluid per vagina as Clear. (new as of yesterday afternoon)  Vitals:  Blood pressure 106/64, pulse 86, temperature 98.2 F (36.8 C), temperature source Oral, resp. rate 20, height 4\' 11"  (1.499 m), weight 128 lb 4.8 oz (58.196 kg), last menstrual period 06/18/2014, SpO2 98 %. Physical Examination:  General appearance - alert, well appearing, and in no distress Abdomen - soft, nontender, nondistended, no masses or organomegaly Extremities - Homan's sign negative bilaterally  Fetal Monitoring:  Baseline: 140 bpm, moderate variability, 15 x 15 accelerations, no decelerations  Labs:  Results for orders placed or performed during the hospital encounter of 12/02/14 (from the past 24 hour(s))  Amnisure rupture of membrane (rom)   Collection Time: 12/11/14  7:40 AM  Result Value Ref Range   Amnisure ROM NEGATIVE    Medications:  Scheduled . docusate sodium  100 mg Oral Daily  . levothyroxine  100 mcg Oral QAC breakfast  . prenatal multivitamin  1 tablet Oral Q1200  . progesterone  200 mg Vaginal QHS   I have reviewed the patient's current medications.  ASSESSMENT: Patient Active Problem List   Diagnosis Date Noted  . [redacted] weeks gestation of pregnancy   . Cervical shortening affecting pregnancy in second trimester 12/02/2014  . Hypothyroid in pregnancy, antepartum   . Insufficient prenatal care     PLAN: No evidence of rupture. Continue pessary. Discuss case with MFM about discharge plan.  Theresa Owens H. 12/11/2014,12:55 PM

## 2014-12-12 NOTE — Progress Notes (Signed)
Patient ID: Theresa Owens, female   DOB: [birth year], 34 y.o.   MRN: [id number] [place]) NOTE  Theresa Owens is a 34 y.o. [phone] at [redacted]w[redacted]d  who is admitted for short cervix, , 3 mm at time of admit.   . Length of Stay:  10  Days  Subjective: Pt denies any bleeding or fluid per vagina. Will be monitored later this a.m. Patient reports the fetal movement as active. Patient reports uterine contraction  activity as none. Patient reports  vaginal bleeding as none. Pessary in place Patient describes fluid per vagina as None.  Vitals:  Blood pressure 85/40, pulse 77, temperature 98.2 F (36.8 C), temperature source Oral, resp. rate 18, height 4\' 11"  (1.499 m), weight 58.196 kg (128 lb 4.8 oz), last menstrual period 06/18/2014, SpO2 98 %. Physical Examination:  General appearance - alert, well appearing, and in no distress, oriented to person, place, and time and normal appearing weight Heart - normal rate and regular rhythm Abdomen - soft, nontender, nondistended Fundal Height:  size equals dates Cervical Exam: Not evaluated. . Extremities: extremities normal, atraumatic, no cyanosis or edema and Homans sign is negative, no sign of DVT with DTRs 2+ bilaterally Membranes:intact  Fetal Monitoring:  Scheduled for later this a.m.  Labs:  Results for orders placed or performed during the hospital encounter of 12/02/14 (from the past 24 hour(s))  Type and screen   Collection Time: 12/11/14 12:50 PM  Result Value Ref Range   ABO/RH(D) AB POS    Antibody Screen NEG    Sample Expiration 12/14/2014     Imaging Studies:     Currently EPIC will not allow sonographic studies to automatically populate into notes.  In the meantime, copy and paste results into note or free text.  Medications:  Scheduled . docusate sodium  100 mg Oral Daily  . levothyroxine  100 mcg Oral QAC breakfast  . prenatal multivitamin  1 tablet Oral Q1200  . progesterone  200 mg Vaginal QHS   I  have reviewed the patient's current medications.  ASSESSMENT: Patient Active Problem List   Diagnosis Date Noted  . [redacted] weeks gestation of pregnancy   . Cervical shortening affecting pregnancy in second trimester 12/02/2014  . Hypothyroid in pregnancy, antepartum   . Insufficient prenatal care     PLAN: Continued inpatient treatment til Monday, when MFM will see pt, and decide on outpt care. The patient has home support fo that she can allow her 2 other children to be cared for by others.  Calynn Ferrero V 12/12/2014,8:58 AM

## 2014-12-13 NOTE — Progress Notes (Signed)
Patient ID: Theresa Owens, female   DOB: 04-01-1981, 34 y.o.   MRN: [id number] [place]) NOTE  Theresa Owens is a 34 y.o. [id number] at [redacted]w[redacted]d by best clinical estimate who is admitted for shortened cervix.   Fetal presentation is breech. Length of Stay:  11  Days  Subjective: Doing well Patient reports the fetal movement as active. Patient reports uterine contraction  activity as none. Patient reports  vaginal bleeding as none. Patient describes fluid per vagina as None.  Vitals:  Blood pressure 94/49, pulse 83, temperature 98 F (36.7 C), temperature source Oral, resp. rate 18, height 4\' 11"  (1.499 m), weight 128 lb 4.8 oz (58.196 kg), last menstrual period 06/18/2014, SpO2 98 %. Physical Examination:  General appearance - alert, well appearing, and in no distress Abdomen - gravid, NT Fundal Height:  size equals dates Extremities: extremities normal, atraumatic, no cyanosis or edema  Membranes:intact  Fetal Monitoring:  Baseline: 145 bpm, Variability: Good {> 6 bpm), Accelerations: Reactive and Decelerations: Absent  Medications:  Scheduled . docusate sodium  100 mg Oral Daily  . levothyroxine  100 mcg Oral QAC breakfast  . prenatal multivitamin  1 tablet Oral Q1200  . progesterone  200 mg Vaginal QHS   I have reviewed the patient's current medications.  ASSESSMENT: Patient Active Problem List   Diagnosis Date Noted  . [redacted] weeks gestation of pregnancy   . Cervical shortening affecting pregnancy in second trimester 12/02/2014  . Hypothyroid in pregnancy, antepartum   . Insufficient prenatal care     PLAN: Continue rest, inpatient monitoring.  Repeat u/s on Monday with possible discharge.  On Prometrium.  [name], MD 12/13/2014,7:12 AM

## 2014-12-14 ENCOUNTER — Inpatient Hospital Stay (HOSPITAL_COMMUNITY): Payer: Medicaid Other

## 2014-12-14 DIAGNOSIS — Z3A25 25 weeks gestation of pregnancy: Secondary | ICD-10-CM | POA: Insufficient documentation

## 2014-12-14 LAB — TYPE AND SCREEN
ABO/RH(D): AB POS
Antibody Screen: NEGATIVE

## 2014-12-14 NOTE — Progress Notes (Signed)
Patient ID: Theresa Owens, female   DOB: Jan 14, 1981, 34 y.o.   MRN: [id number] [place]) NOTE  Theresa Owens is a 34 y.o. [id number] at [redacted]w[redacted]d by best clinical estimate who is admitted for shortened cervix.   Fetal presentation is breech. Length of Stay:  12  Days  Subjective: Doing well Patient reports the fetal movement as active. Patient reports uterine contraction  activity as none. Patient reports  vaginal bleeding as none. Patient describes fluid per vagina as None.  Vitals:  Blood pressure 96/55, pulse 87, temperature 98.4 F (36.9 C), temperature source Oral, resp. rate 18, height 4\' 11"  (1.499 m), weight 128 lb 4.8 oz (58.196 kg), last menstrual period 06/18/2014, SpO2 98 %. Physical Examination: General appearance - alert, well appearing, and in no distress Abdomen - gravid, NT Fundal Height:  size equals dates Extremities: extremities normal, atraumatic, no cyanosis or edema  Membranes:intact  Fetal Monitoring:  Baseline: 145 bpm, Variability: Good {> 6 bpm), Accelerations: Reactive and Decelerations: Absent  Medications:  Scheduled . docusate sodium  100 mg Oral Daily  . levothyroxine  100 mcg Oral QAC breakfast  . prenatal multivitamin  1 tablet Oral Q1200  . progesterone  200 mg Vaginal QHS   I have reviewed the patient's current medications.  ASSESSMENT: Patient Active Problem List   Diagnosis Date Noted  . [redacted] weeks gestation of pregnancy   . Cervical shortening affecting pregnancy in second trimester 12/02/2014  . Hypothyroid in pregnancy, antepartum   . Insufficient prenatal care     PLAN: Continue Prometrium, rest, inpatient monitoring.  Repeat u/s today and evaluate for possible discharge and outpatient management.   Saysha Menta A, MD 12/14/2014,7:54 AM

## 2014-12-15 DIAGNOSIS — O26872 Cervical shortening, second trimester: Principal | ICD-10-CM

## 2014-12-15 MED ORDER — PROGESTERONE MICRONIZED 200 MG PO CAPS: 200.0000 mg | ORAL_CAPSULE | Freq: Every day | ORAL | Status: DC

## 2014-12-15 NOTE — Discharge Summary (Signed)
Physician Discharge Summary  Patient ID: Theresa Owens MRN: [id number] DOB/AGE: Aug 29, 1981 34 y.o.  Admit date: 12/02/2014 Discharge date: 12/15/2014  Admission Diagnoses: Patient Active Problem List   Diagnosis Date Noted  . [redacted] weeks gestation of pregnancy   . [redacted] weeks gestation of pregnancy   . Cervical shortening affecting pregnancy in second trimester 12/02/2014  . Hypothyroid in pregnancy, antepartum   . Insufficient prenatal care      Discharge Diagnoses: same Principal Problem:   Cervical shortening affecting pregnancy in second trimester Active Problems:   [redacted] weeks gestation of pregnancy   [redacted] weeks gestation of pregnancy   Discharged Condition: good  Hospital Course:  Editor: Truett Mainland, DO (Physician)     Expand All Collapse All   [facility]AC[facility] PRACTICE ANTEPARTUM NOTE  Theresa Owens is a 34 y.o. [phone] at [redacted]w[redacted]d who is admitted for shortened cervix.  Fetal presentation is cephalic. Length of Stay: 13 Days  Subjective:  Patient reports good fetal movement. She reports no uterine contractions, no bleeding and no loss of fluid per vagina.  Vitals: Blood pressure 107/63, pulse 75, temperature 97.9 F (36.6 C), temperature source Oral, resp. rate 18, height 4\' 11"  (1.499 m), weight 128 lb 4.8 oz (58.196 kg), last menstrual period 06/18/2014, SpO2 98 %. Physical Examination: General appearance - alert, well appearing, and in no distress Chest - clear to auscultation, no wheezes, rales or rhonchi, symmetric air entry Heart - normal rate, regular rhythm, normal S1, S2, no murmurs, rubs, clicks or gallops Abdomen - soft, nontender, nondistended, no masses or organomegaly Fundal Height: size equals dates Extremities: extremities normal, atraumatic, no cyanosis or edema Membranes:intact  Fetal Monitoring: Baseline: 140s bpm, Variability: Good {> 6 bpm), Accelerations: Non-reactive but appropriate for gestational age and Decelerations: Absent  Labs:  Results for  orders placed or performed during the hospital encounter of 12/02/14 (from the past 24 hour(s))  Type and screen   Collection Time: 12/14/14 12:26 PM  Result Value Ref Range   ABO/RH(D) AB POS    Antibody Screen NEG    Sample Expiration 12/17/2014     Imaging Studies:  [place] 12/14/14 - Cervical length 82mm   Medications: Scheduled . docusate sodium 100 mg Oral Daily  . levothyroxine 100 mcg Oral QAC breakfast  . prenatal multivitamin 1 tablet Oral Q1200  . progesterone 200 mg Vaginal QHS   I have reviewed the patient's current medications.  ASSESSMENT: Patient Active Problem List   Diagnosis Date Noted  . [redacted] weeks gestation of pregnancy   . [redacted] weeks gestation of pregnancy   . Cervical shortening affecting pregnancy in second trimester 12/02/2014  . Hypothyroid in pregnancy, antepartum   . Insufficient prenatal care     PLAN: Discuss with MFM to see if pt can continue treatment as outpatient as she has good improvement of cervical length. Continue NSTs BID. Continue routine antenatal care.   STINSON, JACOB JEHIEL 12/15/2014,7:10 AM       Consults: [facility]  Significant Diagnostic Studies: [place]  Treatments: Prometrium vaginal, pessary placement  Discharge Exam: Blood pressure 110/62, pulse 80, temperature 97.8 F (36.6 C), temperature source Oral, resp. rate 18, height 4\' 11"  (1.499 m), weight 58.196 kg (128 lb 4.8 oz), last menstrual period 06/18/2014, SpO2 98 %. General appearance: alert, cooperative and no distress  Disposition: Discharge home     Medication List    TAKE these medications        PNV PO  Take by mouth.     progesterone  200 MG capsule  Commonly known as:  PROMETRIUM  Place 1 capsule (200 mg total) vaginally at bedtime.     levothyroxine 100 MCG tablet  Commonly known as:  SYNTHROID, LEVOTHROID  Take 100 mcg by mouth daily before breakfast.     SYNTHROID PO  Take by mouth.       ASK your doctor about these medications        docusate sodium 100 MG capsule  Commonly known as:  COLACE  Take 100 mg by mouth 2 (two) times daily.           Follow-up Information    Follow up with [facility] In 1 week.      Signed: [name] 12/15/2014, 6:54 PM

## 2014-12-15 NOTE — Progress Notes (Signed)
Pt discharged off unit via wheelchair with significant other at side. No further concerns.

## 2014-12-15 NOTE — Discharge Instructions (Signed)
Pelvic Rest Pelvic rest is sometimes recommended for women when:   The placenta is partially or completely covering the opening of the cervix (placenta previa).  There is bleeding between the uterine wall and the amniotic sac in the first trimester (subchorionic hemorrhage).  The cervix begins to open without labor starting (incompetent cervix, cervical insufficiency).  The labor is too early (preterm labor). HOME CARE INSTRUCTIONS  Do not have sexual intercourse, stimulation, or an orgasm.  Do not use tampons, douche, or put anything in the vagina.  Do not lift anything over 10 pounds (4.5 kg).  Avoid strenuous activity or straining your pelvic muscles. SEEK MEDICAL CARE IF:  You have any vaginal bleeding during pregnancy. Treat this as a potential emergency.  You have cramping pain felt low in the stomach (stronger than menstrual cramps).  You notice vaginal discharge (watery, mucus, or bloody).  You have a low, dull backache.  There are regular contractions or uterine tightening. SEEK IMMEDIATE MEDICAL CARE IF: You have vaginal bleeding and have placenta previa.  Document Released: 03/17/2011 Document Revised: 02/12/2012 Document Reviewed: 03/17/2011 [facility] Patient Information 2015 [place], [place]. This information is not intended to replace advice given to you by your health care provider. Make sure you discuss any questions you have with your health care provider.

## 2014-12-15 NOTE — Progress Notes (Signed)
Discharge instructions given to patient. Pt verbalized understanding. Pt had no further concerns.

## 2014-12-15 NOTE — Progress Notes (Signed)
FACULTY PRACTICE ANTEPARTUM NOTE  Theresa Owens is a 34 y.o. [id number] at 109w5d  who is admitted for shortened cervix.   Fetal presentation is cephalic. Length of Stay:  13  Days  Subjective:  Patient reports good fetal movement.  She reports no uterine contractions, no bleeding and no loss of fluid per vagina.  Vitals:  Blood pressure 107/63, pulse 75, temperature 97.9 F (36.6 C), temperature source Oral, resp. rate 18, height 4\' 11"  (1.499 m), weight 128 lb 4.8 oz (58.196 kg), last menstrual period 06/18/2014, SpO2 98 %. Physical Examination:  General appearance - alert, well appearing, and in no distress Chest - clear to auscultation, no wheezes, rales or rhonchi, symmetric air entry Heart - normal rate, regular rhythm, normal S1, S2, no murmurs, rubs, clicks or gallops Abdomen - soft, nontender, nondistended, no masses or organomegaly Fundal Height:  size equals dates Extremities: extremities normal, atraumatic, no cyanosis or edema Membranes:intact  Fetal Monitoring:  Baseline: 140s bpm, Variability: Good {> 6 bpm), Accelerations: Non-reactive but appropriate for gestational age and Decelerations: Absent  Labs:  Results for orders placed or performed during the hospital encounter of 12/02/14 (from the past 24 hour(s))  Type and screen   Collection Time: 12/14/14 12:26 PM  Result Value Ref Range   ABO/RH(D) AB POS    Antibody Screen NEG    Sample Expiration 12/17/2014     Imaging Studies:    [place] 12/14/14 - Cervical length 7mm   Medications:  Scheduled . docusate sodium  100 mg Oral Daily  . levothyroxine  100 mcg Oral QAC breakfast  . prenatal multivitamin  1 tablet Oral Q1200  . progesterone  200 mg Vaginal QHS   I have reviewed the patient's current medications.  ASSESSMENT: Patient Active Problem List   Diagnosis Date Noted  . [redacted] weeks gestation of pregnancy   . [redacted] weeks gestation of pregnancy   . Cervical shortening affecting pregnancy in second trimester 12/02/2014   . Hypothyroid in pregnancy, antepartum   . Insufficient prenatal care     PLAN: Discuss with MFM to see if pt can continue treatment as outpatient as she has good improvement of cervical length. Continue NSTs BID. Continue routine antenatal care.   STINSON, JACOB JEHIEL 12/15/2014,7:10 AM

## 2014-12-24 ENCOUNTER — Ambulatory Visit (INDEPENDENT_AMBULATORY_CARE_PROVIDER_SITE_OTHER): Payer: Medicaid Other | Admitting: Family Medicine

## 2014-12-24 ENCOUNTER — Encounter: Payer: Self-pay | Admitting: Family Medicine

## 2014-12-24 VITALS — BP 110/68 | HR 84 | Temp 97.8°F | Wt 132.3 lb

## 2014-12-24 DIAGNOSIS — Z23 Encounter for immunization: Secondary | ICD-10-CM

## 2014-12-24 DIAGNOSIS — O26872 Cervical shortening, second trimester: Secondary | ICD-10-CM

## 2014-12-24 DIAGNOSIS — E039 Hypothyroidism, unspecified: Secondary | ICD-10-CM

## 2014-12-24 DIAGNOSIS — O0932 Supervision of pregnancy with insufficient antenatal care, second trimester: Secondary | ICD-10-CM

## 2014-12-24 DIAGNOSIS — O99282 Endocrine, nutritional and metabolic diseases complicating pregnancy, second trimester: Secondary | ICD-10-CM

## 2014-12-24 LAB — POCT URINALYSIS DIP (DEVICE)
BILIRUBIN URINE: NEGATIVE
Glucose, UA: 500 mg/dL — AB
HGB URINE DIPSTICK: NEGATIVE
Ketones, ur: NEGATIVE mg/dL
NITRITE: NEGATIVE
Protein, ur: NEGATIVE mg/dL
Specific Gravity, Urine: 1.01 (ref 1.005–1.030)
UROBILINOGEN UA: 0.2 mg/dL (ref 0.0–1.0)
pH: 6 (ref 5.0–8.0)

## 2014-12-24 LAB — CBC
HCT: 36.7 % (ref 36.0–46.0)
Hemoglobin: 12.2 g/dL (ref 12.0–15.0)
MCH: 26.3 pg (ref 26.0–34.0)
MCHC: 33.2 g/dL (ref 30.0–36.0)
MCV: 79.1 fL (ref 78.0–100.0)
PLATELETS: 190 10*3/uL (ref 150–400)
RBC: 4.64 MIL/uL (ref 3.87–5.11)
RDW: 14.2 % (ref 11.5–15.5)
WBC: 9.7 10*3/uL (ref 4.0–10.5)

## 2014-12-24 MED ORDER — TETANUS-DIPHTH-ACELL PERTUSSIS 5-2.5-18.5 LF-MCG/0.5 IM SUSP
0.5000 mL | Freq: Once | INTRAMUSCULAR | Status: AC
  Administered 2014-12-24: 0.5 mL via INTRAMUSCULAR

## 2014-12-24 NOTE — Progress Notes (Signed)
Transfer from [facility]. Up to date on lab work. 1hr gtt and 28 week labs today.  Tdap today.

## 2014-12-24 NOTE — Progress Notes (Signed)
U/S for growth and cervical length 12/31/14 @ 11a with [facility] (first avail).

## 2014-12-24 NOTE — Progress Notes (Signed)
Transferred from [facility] for shortened cervix.  Was hospitalized from 12/30 to 1/12.  Had pessary placed and has been doing vaginal prometrium.  CL has been stable and increased from 66mm to 86mm.  No pressure, ctxs, bleeding.   Schedule [place] for CL and growth. 28 week labs.

## 2014-12-25 LAB — HIV ANTIBODY (ROUTINE TESTING W REFLEX): HIV 1&2 Ab, 4th Generation: NONREACTIVE

## 2014-12-25 LAB — RPR

## 2014-12-25 LAB — GLUCOSE TOLERANCE, 1 HOUR (50G) W/O FASTING: Glucose, 1 Hour GTT: 158 mg/dL — ABNORMAL HIGH (ref 70–140)

## 2014-12-25 NOTE — Progress Notes (Signed)
Quick Note:  Patient's 1 hr GTT was elevated. Please call patient to have her come in for a 3 hr GTT. ______ 

## 2014-12-28 ENCOUNTER — Telehealth: Payer: Self-pay

## 2014-12-28 NOTE — Telephone Encounter (Signed)
Called patient and informed her of results and appointment for 3hr gtt tomorrow. Informed patient she will need to be fasting after midnight tonight. Patient verbalized understanding and gratitude. No questions or concerns.

## 2014-12-28 NOTE — Telephone Encounter (Signed)
-----   Message from Mallie Darting sent at 12/28/2014  9:28 AM EST ----- Patient has an appointment for 01/26 at 8:00. Please call patient  Thanks  ----- Message -----    From: Truett Mainland, DO    Sent: 12/25/2014   4:18 PM      To: [facility] Pool  Patient's 1 hr GTT was elevated.  Please call patient to have her come in for a 3 hr GTT.

## 2014-12-29 ENCOUNTER — Other Ambulatory Visit: Payer: Medicaid Other

## 2014-12-29 DIAGNOSIS — R7309 Other abnormal glucose: Secondary | ICD-10-CM

## 2014-12-30 ENCOUNTER — Encounter: Payer: Self-pay | Admitting: Family Medicine

## 2014-12-30 ENCOUNTER — Telehealth: Payer: Self-pay

## 2014-12-30 DIAGNOSIS — O24419 Gestational diabetes mellitus in pregnancy, unspecified control: Secondary | ICD-10-CM | POA: Insufficient documentation

## 2014-12-30 LAB — GLUCOSE TOLERANCE, 3 HOURS
GLUCOSE, 1 HOUR-GESTATIONAL: 194 mg/dL — AB (ref 70–189)
GLUCOSE, 2 HOUR-GESTATIONAL: 145 mg/dL (ref 70–164)
GLUCOSE, FASTING-GESTATIONAL: 97 mg/dL (ref 70–104)
Glucose, GTT - 3 Hour: 170 mg/dL — ABNORMAL HIGH (ref 70–144)

## 2014-12-30 NOTE — Telephone Encounter (Signed)
-----   Message from Theresa Jude, MD sent at 12/30/2014  9:43 AM EST ----- Pt. Failed 3 hour--please bring her in for diabetic teaching on Monday

## 2014-12-30 NOTE — Telephone Encounter (Signed)
Called patient and informed her of results and the need to come in for diabetic education. Patient states she can come in Monday 01/04/15 at 0900. Informed her she will be added to the schedule. Also discussed that she will need to return the following Monday to meet with a provider to go over CBGs and determine POC-- patient verbalized understanding. No questions or concerns.

## 2014-12-31 ENCOUNTER — Other Ambulatory Visit: Payer: Self-pay | Admitting: Family Medicine

## 2014-12-31 ENCOUNTER — Encounter (HOSPITAL_COMMUNITY): Payer: Self-pay

## 2014-12-31 ENCOUNTER — Ambulatory Visit (HOSPITAL_COMMUNITY)
Admission: RE | Admit: 2014-12-31 | Discharge: 2014-12-31 | Disposition: A | Discharge status: Home / Self Care | Payer: Medicaid Other | Source: Ambulatory Visit | Attending: Family Medicine | Admitting: Family Medicine

## 2014-12-31 DIAGNOSIS — O0933 Supervision of pregnancy with insufficient antenatal care, third trimester: Secondary | ICD-10-CM | POA: Insufficient documentation

## 2014-12-31 DIAGNOSIS — O0932 Supervision of pregnancy with insufficient antenatal care, second trimester: Secondary | ICD-10-CM

## 2014-12-31 DIAGNOSIS — O99283 Endocrine, nutritional and metabolic diseases complicating pregnancy, third trimester: Secondary | ICD-10-CM | POA: Diagnosis not present

## 2014-12-31 DIAGNOSIS — E039 Hypothyroidism, unspecified: Secondary | ICD-10-CM | POA: Diagnosis not present

## 2014-12-31 DIAGNOSIS — O26873 Cervical shortening, third trimester: Secondary | ICD-10-CM | POA: Diagnosis not present

## 2014-12-31 DIAGNOSIS — Z3A28 28 weeks gestation of pregnancy: Secondary | ICD-10-CM | POA: Insufficient documentation

## 2014-12-31 DIAGNOSIS — O26872 Cervical shortening, second trimester: Secondary | ICD-10-CM

## 2015-01-04 ENCOUNTER — Encounter: Payer: Medicaid Other | Attending: Family Medicine | Admitting: *Deleted

## 2015-01-04 ENCOUNTER — Ambulatory Visit: Payer: Medicaid Other | Admitting: Obstetrics & Gynecology

## 2015-01-04 VITALS — Wt 133.2 lb

## 2015-01-04 DIAGNOSIS — O24419 Gestational diabetes mellitus in pregnancy, unspecified control: Secondary | ICD-10-CM

## 2015-01-04 DIAGNOSIS — Z713 Dietary counseling and surveillance: Secondary | ICD-10-CM | POA: Diagnosis not present

## 2015-01-04 MED ORDER — ACCU-CHEK FASTCLIX LANCETS MISC
1.0000 | Freq: Four times a day (QID) | Status: DC
Start: 2015-01-04 — End: 2015-03-26

## 2015-01-04 MED ORDER — GLUCOSE BLOOD VI STRP: ORAL_STRIP | Status: DC

## 2015-01-04 NOTE — Progress Notes (Signed)
  Patient was seen on 01/04/15 for Gestational Diabetes self-management .  [place] assisted. The following learning objectives were met by the patient :   States the definition of Gestational Diabetes  States when to check blood glucose levels  Demonstrates proper blood glucose monitoring techniques  States the effect of stress and exercise on blood glucose levels  States the importance of limiting caffeine and abstaining from alcohol and smoking  Plan:  Consider  increasing your activity level by walking daily as tolerated Begin checking BG before breakfast and 1-2 hours after first bit of breakfast, lunch and dinner after  as directed by MD  Take medication  as directed by MD  Blood glucose monitor given: Accu Chek Nano BG Monitoring Kit Lot # [id number] Exp: 07/04/15 Blood glucose reading: FBS $RemoveBefo'88mg'uUamnSIaADJ$ /dl  Patient instructed to monitor glucose levels: FBS: 60 - <90 2 hour: <120  Patient received the following handouts:  Nutrition Diabetes and Pregnancy  Patient will be seen for follow-up as needed.

## 2015-01-04 NOTE — Progress Notes (Signed)
Nutrition note: GDM diet education Pt is a newly diagnosed GDM pt. Pt has gained 9.2# @ [redacted]w[redacted]d, which is wnl. Pt reports eating 3-4 meals & 1-2 snacks/d. Pt is taking a PNV. Pt reports no N/V or heartburn. NKFA. Pt reports she is on bedrest. Pt received verbal & written education about GDM diet. Discussed wt gain goals of 15-25# or 0.6#/wk. Pt agrees to follow GDM diet with 3 meals & 1-3 snacks/d with proper CHO/ protein combination.  Pt has [place] & plans to BF. F/u in 2-4 wks Vladimir Faster, MS, RD, LDN, [facility]

## 2015-01-07 ENCOUNTER — Ambulatory Visit (INDEPENDENT_AMBULATORY_CARE_PROVIDER_SITE_OTHER): Payer: Medicaid Other | Admitting: Family Medicine

## 2015-01-07 VITALS — BP 118/58 | Temp 98.6°F | Wt 132.3 lb

## 2015-01-07 DIAGNOSIS — O0932 Supervision of pregnancy with insufficient antenatal care, second trimester: Secondary | ICD-10-CM

## 2015-01-07 DIAGNOSIS — O26872 Cervical shortening, second trimester: Secondary | ICD-10-CM

## 2015-01-07 DIAGNOSIS — O24419 Gestational diabetes mellitus in pregnancy, unspecified control: Secondary | ICD-10-CM

## 2015-01-07 LAB — POCT URINALYSIS DIP (DEVICE)
Glucose, UA: NEGATIVE mg/dL
Ketones, ur: NEGATIVE mg/dL
Nitrite: NEGATIVE
PH: 6 (ref 5.0–8.0)
PROTEIN: NEGATIVE mg/dL
SPECIFIC GRAVITY, URINE: 1.025 (ref 1.005–1.030)
Urobilinogen, UA: 1 mg/dL (ref 0.0–1.0)

## 2015-01-07 NOTE — Progress Notes (Signed)
Patient is 34 y.o. [id number] [redacted]w[redacted]d.  +FM, denies LOF, VB, contractions, vaginal discharge - ADM: new dx, started checking CBGs 2/1  fasting 88/95/86/96  2h pp: 87 125/91/134 96/166/126  => no meds started today, discussed diet, reports does not have money for healthy food, does not have food stamps yet but reports she should soon, at which point she thinks she can eat healthier.  At next visit if still uncontrolled advised would likely start medication - cervical shortening: has repeat sono next week, if CL stable/unchanged no more CLs needed

## 2015-01-15 ENCOUNTER — Encounter (HOSPITAL_COMMUNITY): Payer: Self-pay

## 2015-01-15 ENCOUNTER — Ambulatory Visit (HOSPITAL_COMMUNITY)
Admission: RE | Admit: 2015-01-15 | Discharge: 2015-01-15 | Disposition: A | Discharge status: Home / Self Care | Payer: Medicaid Other | Source: Ambulatory Visit | Attending: Family Medicine | Admitting: Family Medicine

## 2015-01-15 VITALS — BP 109/70 | HR 83 | Wt 132.0 lb

## 2015-01-15 DIAGNOSIS — O26872 Cervical shortening, second trimester: Secondary | ICD-10-CM

## 2015-01-15 DIAGNOSIS — O24419 Gestational diabetes mellitus in pregnancy, unspecified control: Secondary | ICD-10-CM

## 2015-01-15 DIAGNOSIS — O26873 Cervical shortening, third trimester: Secondary | ICD-10-CM | POA: Diagnosis present

## 2015-01-15 DIAGNOSIS — O99283 Endocrine, nutritional and metabolic diseases complicating pregnancy, third trimester: Secondary | ICD-10-CM | POA: Diagnosis not present

## 2015-01-15 DIAGNOSIS — Z3A30 30 weeks gestation of pregnancy: Secondary | ICD-10-CM | POA: Insufficient documentation

## 2015-01-15 DIAGNOSIS — O0933 Supervision of pregnancy with insufficient antenatal care, third trimester: Secondary | ICD-10-CM | POA: Diagnosis not present

## 2015-01-15 DIAGNOSIS — E039 Hypothyroidism, unspecified: Secondary | ICD-10-CM | POA: Diagnosis not present

## 2015-01-21 ENCOUNTER — Ambulatory Visit (INDEPENDENT_AMBULATORY_CARE_PROVIDER_SITE_OTHER): Payer: Medicaid Other | Admitting: Family Medicine

## 2015-01-21 VITALS — BP 118/70 | HR 82 | Temp 98.1°F | Wt 132.7 lb

## 2015-01-21 DIAGNOSIS — O26872 Cervical shortening, second trimester: Secondary | ICD-10-CM

## 2015-01-21 LAB — POCT URINALYSIS DIP (DEVICE)
Bilirubin Urine: NEGATIVE
Glucose, UA: 100 mg/dL — AB
Ketones, ur: NEGATIVE mg/dL
NITRITE: NEGATIVE
PH: 7 (ref 5.0–8.0)
PROTEIN: NEGATIVE mg/dL
Specific Gravity, Urine: 1.02 (ref 1.005–1.030)
UROBILINOGEN UA: 0.2 mg/dL (ref 0.0–1.0)

## 2015-01-21 MED ORDER — TRIAMCINOLONE ACETONIDE 0.5 % EX OINT: 1.0000 "application " | TOPICAL_OINTMENT | Freq: Two times a day (BID) | CUTANEOUS | Status: DC

## 2015-01-21 NOTE — Patient Instructions (Signed)
Pruritic Urticarial Papules and Plaques of Pregnancy When you are pregnant, your body changes in many ways. That includes the skin. Rashes sometimes develop. One skin rash that can happen during pregnancy is called pruritic urticarial papules and plaques of pregnancy (PUPPP). The small red bumps sometimes form large plaques. These are very itchy. The rash usually appears in the last few weeks of pregnancy during the third trimester. Sometimes, it can occur shortly after giving birth. It goes away shortly after your baby is born. It does not harm you or your baby and will not leave scars on your skin. PUPPP is most common in first pregnancies or in those involving more than one baby. It usually will not return during later pregnancies. CAUSES  The exact cause is unknown. However, it may be related to your skin stretching rapidly due to pregnancy.  SYMPTOMS  PUPPP symptoms include a very itchy rash. The rash often looks red and raised and is most often seen on the abdomen. It can spread to the legs, thighs, or arms. Sometimes tiny blisters form in the center of the rash patches. The skin around the rash is often pale. DIAGNOSIS  To decide if you have PUPPP, your health care provider will perform a physical exam and ask questions about your symptoms. He or she may order blood tests to rule out other causes of the rash. TREATMENT  The goal is to stop the itching and keep the rash from spreading. Usually, a cream is used to do this. However, treatment varies. Common options include medicines that relieve or lessen itching. Some medicines may be in the form of a cream or ointment, while others you may take by mouth (orally). The medicines are either corticosteroids or antihistamines. Treatment helps nearly all women with this rash. The creams, ointments, or pills should make your skin feel better fairly quickly.  HOME CARE INSTRUCTIONS   Only take over-the-counter or prescription medicines as directed by your  health care provider.  Apply any creams as directed by your health care provider.  Do not scratch the rash.  Wear loose clothing.  Keep all follow-up appointments with your health care provider. SEEK MEDICAL CARE IF:   The itching does not go away after treatment.  Your rash continues to spread.  You are unable to sleep because of the irritation. Document Released: 02/14/2010 Document Revised: 07/23/2013 Document Reviewed: 05/04/2013 [facility] Patient Information 2015 [place], [place]. This information is not intended to replace advice given to you by your health care provider. Make sure you discuss any questions you have with your health care provider.

## 2015-01-21 NOTE — Progress Notes (Signed)
Patient is 34 y.o. [id number] [redacted]w[redacted]d.  +FM, denies LOF, VB, contractions, vaginal discharge ADM: - fasting: 81 81 82 94 79 78 87 85 85 80  84 77 87 91 2h PP breakfast: 91 143 91 122 86 109 79 76 75 84  76 77 79 106 152   lunch: 108 84 107 90 93 130 106 104 99 107  100 76 79  Dinner: 94 107 11 98 91 132 129 101 124 98  151 97 => continue, no medications now diet improved CBGs now mostly within normal limits RASH: x 2 weeks, chest and back itch, no where else => petechia => PUPP, rx triamcinolone cream, OTC benadryl prn

## 2015-02-04 ENCOUNTER — Ambulatory Visit (INDEPENDENT_AMBULATORY_CARE_PROVIDER_SITE_OTHER): Payer: Medicaid Other | Admitting: Obstetrics & Gynecology

## 2015-02-04 VITALS — BP 105/64 | HR 92 | Temp 98.0°F | Wt 132.7 lb

## 2015-02-04 DIAGNOSIS — E039 Hypothyroidism, unspecified: Secondary | ICD-10-CM

## 2015-02-04 DIAGNOSIS — Z3493 Encounter for supervision of normal pregnancy, unspecified, third trimester: Secondary | ICD-10-CM

## 2015-02-04 LAB — TSH: TSH: 0.644 u[IU]/mL (ref 0.350–4.500)

## 2015-02-04 NOTE — Progress Notes (Signed)
FBS 80-96, PP few 151, 150, most below 130, will continue diet control.

## 2015-02-04 NOTE — Patient Instructions (Signed)
Third Trimester of Pregnancy The third trimester is from week 29 through week 42, months 7 through 9. The third trimester is a time when the fetus is growing rapidly. At the end of the ninth month, the fetus is about 20 inches in length and weighs 6-10 pounds.  BODY CHANGES Your body goes through many changes during pregnancy. The changes vary from woman to woman.   Your weight will continue to increase. You can expect to gain 25-35 pounds (11-16 kg) by the end of the pregnancy.  You may begin to get stretch marks on your hips, abdomen, and breasts.  You may urinate more often because the fetus is moving lower into your pelvis and pressing on your bladder.  You may develop or continue to have heartburn as a result of your pregnancy.  You may develop constipation because certain hormones are causing the muscles that push waste through your intestines to slow down.  You may develop hemorrhoids or swollen, bulging veins (varicose veins).  You may have pelvic pain because of the weight gain and pregnancy hormones relaxing your joints between the bones in your pelvis. Backaches may result from overexertion of the muscles supporting your posture.  You may have changes in your hair. These can include thickening of your hair, rapid growth, and changes in texture. Some women also have hair loss during or after pregnancy, or hair that feels dry or thin. Your hair will most likely return to normal after your baby is born.  Your breasts will continue to grow and be tender. A yellow discharge may leak from your breasts called colostrum.  Your belly button may stick out.  You may feel short of breath because of your expanding uterus.  You may notice the fetus "dropping," or moving lower in your abdomen.  You may have a bloody mucus discharge. This usually occurs a few days to a week before labor begins.  Your cervix becomes thin and soft (effaced) near your due date. WHAT TO EXPECT AT YOUR PRENATAL  EXAMS  You will have prenatal exams every 2 weeks until week 36. Then, you will have weekly prenatal exams. During a routine prenatal visit:  You will be weighed to make sure you and the fetus are growing normally.  Your blood pressure is taken.  Your abdomen will be measured to track your baby's growth.  The fetal heartbeat will be listened to.  Any test results from the previous visit will be discussed.  You may have a cervical check near your due date to see if you have effaced. At around 36 weeks, your caregiver will check your cervix. At the same time, your caregiver will also perform a test on the secretions of the vaginal tissue. This test is to determine if a type of bacteria, Group B streptococcus, is present. Your caregiver will explain this further. Your caregiver may ask you:  What your birth plan is.  How you are feeling.  If you are feeling the baby move.  If you have had any abnormal symptoms, such as leaking fluid, bleeding, severe headaches, or abdominal cramping.  If you have any questions. Other tests or screenings that may be performed during your third trimester include:  Blood tests that check for low iron levels (anemia).  Fetal testing to check the health, activity level, and growth of the fetus. Testing is done if you have certain medical conditions or if there are problems during the pregnancy. FALSE LABOR You may feel small, irregular contractions that   eventually go away. These are called Braxton Hicks contractions, or false labor. Contractions may last for hours, days, or even weeks before true labor sets in. If contractions come at regular intervals, intensify, or become painful, it is best to be seen by your caregiver.  SIGNS OF LABOR   Menstrual-like cramps.  Contractions that are 5 minutes apart or less.  Contractions that start on the top of the uterus and spread down to the lower abdomen and back.  A sense of increased pelvic pressure or back  pain.  A watery or bloody mucus discharge that comes from the vagina. If you have any of these signs before the 37th week of pregnancy, call your caregiver right away. You need to go to the hospital to get checked immediately. HOME CARE INSTRUCTIONS   Avoid all smoking, herbs, alcohol, and unprescribed drugs. These chemicals affect the formation and growth of the baby.  Follow your caregiver's instructions regarding medicine use. There are medicines that are either safe or unsafe to take during pregnancy.  Exercise only as directed by your caregiver. Experiencing uterine cramps is a good sign to stop exercising.  Continue to eat regular, healthy meals.  Wear a good support bra for breast tenderness.  Do not use hot tubs, steam rooms, or saunas.  Wear your seat belt at all times when driving.  Avoid raw meat, uncooked cheese, cat litter boxes, and soil used by cats. These carry germs that can cause birth defects in the baby.  Take your prenatal vitamins.  Try taking a stool softener (if your caregiver approves) if you develop constipation. Eat more high-fiber foods, such as fresh vegetables or fruit and whole grains. Drink plenty of fluids to keep your urine clear or pale yellow.  Take warm sitz baths to soothe any pain or discomfort caused by hemorrhoids. Use hemorrhoid cream if your caregiver approves.  If you develop varicose veins, wear support hose. Elevate your feet for 15 minutes, 3-4 times a day. Limit salt in your diet.  Avoid heavy lifting, wear low heal shoes, and practice good posture.  Rest a lot with your legs elevated if you have leg cramps or low back pain.  Visit your dentist if you have not gone during your pregnancy. Use a soft toothbrush to brush your teeth and be gentle when you floss.  A sexual relationship may be continued unless your caregiver directs you otherwise.  Do not travel far distances unless it is absolutely necessary and only with the approval  of your caregiver.  Take prenatal classes to understand, practice, and ask questions about the labor and delivery.  Make a trial run to the hospital.  Pack your hospital bag.  Prepare the baby's nursery.  Continue to go to all your prenatal visits as directed by your caregiver. SEEK MEDICAL CARE IF:  You are unsure if you are in labor or if your water has broken.  You have dizziness.  You have mild pelvic cramps, pelvic pressure, or nagging pain in your abdominal area.  You have persistent nausea, vomiting, or diarrhea.  You have a bad smelling vaginal discharge.  You have pain with urination. SEEK IMMEDIATE MEDICAL CARE IF:   You have a fever.  You are leaking fluid from your vagina.  You have spotting or bleeding from your vagina.  You have severe abdominal cramping or pain.  You have rapid weight loss or gain.  You have shortness of breath with chest pain.  You notice sudden or extreme swelling   of your face, hands, ankles, feet, or legs.  You have not felt your baby move in over an hour.  You have severe headaches that do not go away with medicine.  You have vision changes. Document Released: 11/14/2001 Document Revised: 11/25/2013 Document Reviewed: 01/21/2013 [employer] Patient Information 2015 [employer]. This information is not intended to replace advice given to you by your health care provider. Make sure you discuss any questions you have with your health care provider.  

## 2015-02-15 ENCOUNTER — Ambulatory Visit (INDEPENDENT_AMBULATORY_CARE_PROVIDER_SITE_OTHER): Payer: Medicaid Other | Admitting: Obstetrics & Gynecology

## 2015-02-15 VITALS — BP 109/77 | HR 80 | Temp 98.0°F | Wt 129.8 lb

## 2015-02-15 DIAGNOSIS — O0933 Supervision of pregnancy with insufficient antenatal care, third trimester: Secondary | ICD-10-CM

## 2015-02-15 DIAGNOSIS — O24419 Gestational diabetes mellitus in pregnancy, unspecified control: Secondary | ICD-10-CM

## 2015-02-15 DIAGNOSIS — O26872 Cervical shortening, second trimester: Secondary | ICD-10-CM

## 2015-02-15 LAB — POCT URINALYSIS DIP (DEVICE)
BILIRUBIN URINE: NEGATIVE
Glucose, UA: NEGATIVE mg/dL
HGB URINE DIPSTICK: NEGATIVE
KETONES UR: 15 mg/dL — AB
Nitrite: NEGATIVE
Protein, ur: NEGATIVE mg/dL
SPECIFIC GRAVITY, URINE: 1.01 (ref 1.005–1.030)
Urobilinogen, UA: 1 mg/dL (ref 0.0–1.0)
pH: 6 (ref 5.0–8.0)

## 2015-02-15 NOTE — Progress Notes (Signed)
Blood sugars within range, continue diet control.  38 week growth scan scheduled. Remove pessary, pelvic cultures at next visit.  Preterm labor and fetal movement precautions reviewed.

## 2015-02-15 NOTE — Patient Instructions (Signed)
Return to clinic for any obstetric concerns or go to [facility] for evaluation  

## 2015-02-15 NOTE — Progress Notes (Signed)
Growth U/S 03/11/15 @ 1045a with Radiology.

## 2015-03-01 ENCOUNTER — Ambulatory Visit (INDEPENDENT_AMBULATORY_CARE_PROVIDER_SITE_OTHER): Payer: Medicaid Other | Admitting: Obstetrics and Gynecology

## 2015-03-01 VITALS — BP 123/71 | HR 74 | Temp 97.3°F | Wt 135.9 lb

## 2015-03-01 DIAGNOSIS — O24419 Gestational diabetes mellitus in pregnancy, unspecified control: Secondary | ICD-10-CM

## 2015-03-01 DIAGNOSIS — O26872 Cervical shortening, second trimester: Secondary | ICD-10-CM

## 2015-03-01 DIAGNOSIS — E039 Hypothyroidism, unspecified: Secondary | ICD-10-CM

## 2015-03-01 DIAGNOSIS — O99283 Endocrine, nutritional and metabolic diseases complicating pregnancy, third trimester: Secondary | ICD-10-CM

## 2015-03-01 DIAGNOSIS — O0933 Supervision of pregnancy with insufficient antenatal care, third trimester: Secondary | ICD-10-CM

## 2015-03-01 LAB — POCT URINALYSIS DIP (DEVICE)
Bilirubin Urine: NEGATIVE
Glucose, UA: 250 mg/dL — AB
HGB URINE DIPSTICK: NEGATIVE
Ketones, ur: NEGATIVE mg/dL
Nitrite: NEGATIVE
PROTEIN: NEGATIVE mg/dL
Urobilinogen, UA: 0.2 mg/dL (ref 0.0–1.0)
pH: 7 (ref 5.0–8.0)

## 2015-03-01 LAB — OB RESULTS CONSOLE GBS: STREP GROUP B AG: NEGATIVE

## 2015-03-01 NOTE — Progress Notes (Signed)
34 y.o. [id number] at [redacted]w[redacted]d here for routine OB visit. Doing well. No concerns or complaints today.  1. A1GDM, well controlled. Reviewed blood glucose values and mostly at goal today. Several evening blood glucose values slightly above goal, patient thinks secondary to eating dessert. Will work on diet. Growth ultrasound scheduled for 38 weeks. Will need delivery by 40 weeks.  2. Hypothyroidism. Continue synthroid 100 mcg once daily. Denies signs/symptoms of hypo/hyperthyroidism.  3. Cervical shortening in this pregnancy. Pessary removed today.  4. Routine PNC. Vaginal cultures today. FM/PTL precautions reviewed. RTC 1 week.

## 2015-03-01 NOTE — Progress Notes (Signed)
GBS and cultures today.  

## 2015-03-02 LAB — GC/CHLAMYDIA PROBE AMP
CT Probe RNA: NEGATIVE
GC Probe RNA: NEGATIVE

## 2015-03-03 LAB — CULTURE, BETA STREP (GROUP B ONLY)

## 2015-03-08 ENCOUNTER — Ambulatory Visit (INDEPENDENT_AMBULATORY_CARE_PROVIDER_SITE_OTHER): Payer: Self-pay | Admitting: Family Medicine

## 2015-03-08 ENCOUNTER — Ambulatory Visit (HOSPITAL_COMMUNITY): Payer: Medicaid Other

## 2015-03-08 VITALS — BP 117/75 | HR 66 | Temp 97.7°F | Wt 134.1 lb

## 2015-03-08 DIAGNOSIS — O0933 Supervision of pregnancy with insufficient antenatal care, third trimester: Secondary | ICD-10-CM

## 2015-03-08 LAB — POCT URINALYSIS DIP (DEVICE)
Bilirubin Urine: NEGATIVE
GLUCOSE, UA: NEGATIVE mg/dL
Ketones, ur: NEGATIVE mg/dL
NITRITE: NEGATIVE
Protein, ur: NEGATIVE mg/dL
Specific Gravity, Urine: 1.01 (ref 1.005–1.030)
UROBILINOGEN UA: 0.2 mg/dL (ref 0.0–1.0)
pH: 7 (ref 5.0–8.0)

## 2015-03-08 NOTE — Progress Notes (Signed)
Trace blood and moderate leukocytes.

## 2015-03-08 NOTE — Progress Notes (Signed)
FBS 69-101 (1 out of range) 2 hour pp 65-132 (2 out of range)BS look good U/s for growth on 4/7

## 2015-03-11 ENCOUNTER — Ambulatory Visit (HOSPITAL_COMMUNITY)
Admission: RE | Admit: 2015-03-11 | Discharge: 2015-03-11 | Disposition: A | Discharge status: Home / Self Care | Payer: Medicaid Other | Source: Ambulatory Visit | Attending: Obstetrics & Gynecology | Admitting: Obstetrics & Gynecology

## 2015-03-11 DIAGNOSIS — O0933 Supervision of pregnancy with insufficient antenatal care, third trimester: Secondary | ICD-10-CM | POA: Diagnosis not present

## 2015-03-11 DIAGNOSIS — O24410 Gestational diabetes mellitus in pregnancy, diet controlled: Secondary | ICD-10-CM | POA: Insufficient documentation

## 2015-03-11 DIAGNOSIS — Z3A38 38 weeks gestation of pregnancy: Secondary | ICD-10-CM | POA: Diagnosis not present

## 2015-03-11 DIAGNOSIS — O3433 Maternal care for cervical incompetence, third trimester: Secondary | ICD-10-CM | POA: Insufficient documentation

## 2015-03-11 DIAGNOSIS — O24419 Gestational diabetes mellitus in pregnancy, unspecified control: Secondary | ICD-10-CM

## 2015-03-15 ENCOUNTER — Ambulatory Visit (INDEPENDENT_AMBULATORY_CARE_PROVIDER_SITE_OTHER): Payer: Self-pay | Admitting: Obstetrics & Gynecology

## 2015-03-15 VITALS — BP 110/77 | HR 79 | Temp 97.4°F | Wt 136.5 lb

## 2015-03-15 DIAGNOSIS — O24419 Gestational diabetes mellitus in pregnancy, unspecified control: Secondary | ICD-10-CM

## 2015-03-15 LAB — POCT URINALYSIS DIP (DEVICE)
BILIRUBIN URINE: NEGATIVE
GLUCOSE, UA: NEGATIVE mg/dL
Hgb urine dipstick: NEGATIVE
Ketones, ur: NEGATIVE mg/dL
NITRITE: NEGATIVE
Protein, ur: NEGATIVE mg/dL
Specific Gravity, Urine: 1.01 (ref 1.005–1.030)
Urobilinogen, UA: 0.2 mg/dL (ref 0.0–1.0)
pH: 7 (ref 5.0–8.0)

## 2015-03-15 NOTE — Patient Instructions (Signed)
Return to clinic for any obstetric concerns or go to [facility] for evaluation  

## 2015-03-15 NOTE — Progress Notes (Signed)
Blood sugars are within range EFW on 03/11/15 was 3170g (62%), AFI 12 cm, cephalic. IOL at 40 weeks if still pregnant - scheduled on 03/25/15 at 0730.  Patient is aware.  No other complaints or concerns.  Labor and fetal movement precautions reviewed.

## 2015-03-16 ENCOUNTER — Telehealth (HOSPITAL_COMMUNITY): Payer: Self-pay | Admitting: *Deleted

## 2015-03-16 NOTE — Telephone Encounter (Signed)
Preadmission screen  

## 2015-03-17 ENCOUNTER — Encounter (HOSPITAL_COMMUNITY): Payer: Self-pay | Admitting: *Deleted

## 2015-03-22 ENCOUNTER — Ambulatory Visit (INDEPENDENT_AMBULATORY_CARE_PROVIDER_SITE_OTHER): Payer: Self-pay | Admitting: Obstetrics & Gynecology

## 2015-03-22 VITALS — BP 127/75 | HR 67 | Temp 97.4°F | Wt 135.8 lb

## 2015-03-22 DIAGNOSIS — O0933 Supervision of pregnancy with insufficient antenatal care, third trimester: Secondary | ICD-10-CM

## 2015-03-22 LAB — POCT URINALYSIS DIP (DEVICE)
Bilirubin Urine: NEGATIVE
Glucose, UA: NEGATIVE mg/dL
KETONES UR: NEGATIVE mg/dL
NITRITE: NEGATIVE
PH: 7 (ref 5.0–8.0)
Protein, ur: NEGATIVE mg/dL
Specific Gravity, Urine: 1.01 (ref 1.005–1.030)
UROBILINOGEN UA: 0.2 mg/dL (ref 0.0–1.0)

## 2015-03-22 NOTE — Progress Notes (Signed)
Nml glucose.  Induction scheduled at 40 weeks.

## 2015-03-22 NOTE — Progress Notes (Signed)
Quad results from 10/19/14 negative, done at [facility], scanned report in EPIC (01/01/15).

## 2015-03-22 NOTE — Progress Notes (Signed)
Moderate Leukocytes in urine.

## 2015-03-25 ENCOUNTER — Encounter (HOSPITAL_COMMUNITY): Payer: Self-pay | Admitting: Anesthesiology

## 2015-03-25 ENCOUNTER — Encounter (HOSPITAL_COMMUNITY): Payer: Self-pay

## 2015-03-25 ENCOUNTER — Inpatient Hospital Stay (HOSPITAL_COMMUNITY)
Admission: RE | Admit: 2015-03-25 | Discharge: 2015-03-26 | DRG: 775 | Disposition: A | Discharge status: Home / Self Care | Payer: Medicaid Other | Source: Ambulatory Visit | Attending: Family Medicine | Admitting: Family Medicine

## 2015-03-25 VITALS — BP 107/60 | HR 71 | Temp 98.3°F | Resp 18 | Ht 59.0 in | Wt 135.0 lb

## 2015-03-25 DIAGNOSIS — E039 Hypothyroidism, unspecified: Secondary | ICD-10-CM | POA: Diagnosis present

## 2015-03-25 DIAGNOSIS — O24429 Gestational diabetes mellitus in childbirth, unspecified control: Secondary | ICD-10-CM

## 2015-03-25 DIAGNOSIS — Z3A40 40 weeks gestation of pregnancy: Secondary | ICD-10-CM | POA: Diagnosis present

## 2015-03-25 DIAGNOSIS — O24420 Gestational diabetes mellitus in childbirth, diet controlled: Principal | ICD-10-CM | POA: Diagnosis present

## 2015-03-25 DIAGNOSIS — Z3483 Encounter for supervision of other normal pregnancy, third trimester: Secondary | ICD-10-CM | POA: Diagnosis present

## 2015-03-25 DIAGNOSIS — Z3490 Encounter for supervision of normal pregnancy, unspecified, unspecified trimester: Secondary | ICD-10-CM

## 2015-03-25 DIAGNOSIS — Z79899 Other long term (current) drug therapy: Secondary | ICD-10-CM

## 2015-03-25 DIAGNOSIS — O24419 Gestational diabetes mellitus in pregnancy, unspecified control: Secondary | ICD-10-CM

## 2015-03-25 DIAGNOSIS — O99284 Endocrine, nutritional and metabolic diseases complicating childbirth: Secondary | ICD-10-CM | POA: Diagnosis present

## 2015-03-25 LAB — CBC
HCT: 37.6 % (ref 36.0–46.0)
Hemoglobin: 12.7 g/dL (ref 12.0–15.0)
MCH: 26.1 pg (ref 26.0–34.0)
MCHC: 33.8 g/dL (ref 30.0–36.0)
MCV: 77.4 fL — ABNORMAL LOW (ref 78.0–100.0)
PLATELETS: 156 10*3/uL (ref 150–400)
RBC: 4.86 MIL/uL (ref 3.87–5.11)
RDW: 16.5 % — AB (ref 11.5–15.5)
WBC: 12.7 10*3/uL — AB (ref 4.0–10.5)

## 2015-03-25 LAB — TYPE AND SCREEN
ABO/RH(D): AB POS
Antibody Screen: NEGATIVE

## 2015-03-25 LAB — RPR: RPR: NONREACTIVE

## 2015-03-25 LAB — GLUCOSE, CAPILLARY: GLUCOSE-CAPILLARY: 126 mg/dL — AB (ref 70–99)

## 2015-03-25 MED ORDER — IBUPROFEN 600 MG PO TABS
600.0000 mg | ORAL_TABLET | Freq: Four times a day (QID) | ORAL | Status: DC
  Administered 2015-03-25 – 2015-03-26 (×4): 600 mg via ORAL
  Filled 2015-03-25 (×4): qty 1

## 2015-03-25 MED ORDER — SIMETHICONE 80 MG PO CHEW: 80.0000 mg | CHEWABLE_TABLET | ORAL | Status: DC | PRN

## 2015-03-25 MED ORDER — ONDANSETRON HCL 4 MG/2ML IJ SOLN: 4.0000 mg | INTRAMUSCULAR | Status: DC | PRN

## 2015-03-25 MED ORDER — TERBUTALINE SULFATE 1 MG/ML IJ SOLN: 0.2500 mg | Freq: Once | INTRAMUSCULAR | Status: DC | PRN

## 2015-03-25 MED ORDER — ONDANSETRON HCL 4 MG PO TABS: 4.0000 mg | ORAL_TABLET | ORAL | Status: DC | PRN

## 2015-03-25 MED ORDER — SODIUM CHLORIDE 0.9 % IJ SOLN: 3.0000 mL | Freq: Two times a day (BID) | INTRAMUSCULAR | Status: DC

## 2015-03-25 MED ORDER — OXYTOCIN 40 UNITS IN LACTATED RINGERS INFUSION - SIMPLE MED
1.0000 m[IU]/min | INTRAVENOUS | Status: DC
  Administered 2015-03-25: 2 m[IU]/min via INTRAVENOUS
  Filled 2015-03-25: qty 1000

## 2015-03-25 MED ORDER — DIPHENHYDRAMINE HCL 50 MG/ML IJ SOLN: 12.5000 mg | INTRAMUSCULAR | Status: DC | PRN

## 2015-03-25 MED ORDER — LACTATED RINGERS IV SOLN: 500.0000 mL | Freq: Once | INTRAVENOUS | Status: DC

## 2015-03-25 MED ORDER — ZOLPIDEM TARTRATE 5 MG PO TABS: 5.0000 mg | ORAL_TABLET | Freq: Every evening | ORAL | Status: DC | PRN

## 2015-03-25 MED ORDER — PHENYLEPHRINE 40 MCG/ML (10ML) SYRINGE FOR IV PUSH (FOR BLOOD PRESSURE SUPPORT)
80.0000 ug | PREFILLED_SYRINGE | INTRAVENOUS | Status: DC | PRN
  Filled 2015-03-25: qty 20

## 2015-03-25 MED ORDER — PRENATAL MULTIVITAMIN CH
1.0000 | ORAL_TABLET | Freq: Every day | ORAL | Status: DC
  Administered 2015-03-26: 1 via ORAL
  Filled 2015-03-25: qty 1

## 2015-03-25 MED ORDER — OXYTOCIN 40 UNITS IN LACTATED RINGERS INFUSION - SIMPLE MED: 62.5000 mL/h | INTRAVENOUS | Status: DC | PRN

## 2015-03-25 MED ORDER — OXYTOCIN 40 UNITS IN LACTATED RINGERS INFUSION - SIMPLE MED
62.5000 mL/h | INTRAVENOUS | Status: DC
  Filled 2015-03-25: qty 1000

## 2015-03-25 MED ORDER — SODIUM CHLORIDE 0.9 % IV SOLN: 250.0000 mL | INTRAVENOUS | Status: DC | PRN

## 2015-03-25 MED ORDER — OXYTOCIN BOLUS FROM INFUSION
500.0000 mL | INTRAVENOUS | Status: DC
  Administered 2015-03-25: 500 mL via INTRAVENOUS

## 2015-03-25 MED ORDER — OXYCODONE-ACETAMINOPHEN 5-325 MG PO TABS: 1.0000 | ORAL_TABLET | ORAL | Status: DC | PRN

## 2015-03-25 MED ORDER — LANOLIN HYDROUS EX OINT: TOPICAL_OINTMENT | CUTANEOUS | Status: DC | PRN

## 2015-03-25 MED ORDER — LIDOCAINE HCL (PF) 1 % IJ SOLN
30.0000 mL | INTRAMUSCULAR | Status: DC | PRN
  Administered 2015-03-25: 30 mL via SUBCUTANEOUS
  Filled 2015-03-25: qty 30

## 2015-03-25 MED ORDER — DIPHENHYDRAMINE HCL 25 MG PO CAPS: 25.0000 mg | ORAL_CAPSULE | Freq: Four times a day (QID) | ORAL | Status: DC | PRN

## 2015-03-25 MED ORDER — FENTANYL 2.5 MCG/ML BUPIVACAINE 1/10 % EPIDURAL INFUSION (WH - ANES)
14.0000 mL/h | INTRAMUSCULAR | Status: DC | PRN
  Administered 2015-03-25: 14 mL/h via EPIDURAL
  Filled 2015-03-25: qty 125

## 2015-03-25 MED ORDER — BENZOCAINE-MENTHOL 20-0.5 % EX AERO
1.0000 "application " | INHALATION_SPRAY | CUTANEOUS | Status: DC | PRN
  Administered 2015-03-25: 1 via TOPICAL
  Filled 2015-03-25 (×2): qty 56

## 2015-03-25 MED ORDER — SENNOSIDES-DOCUSATE SODIUM 8.6-50 MG PO TABS
2.0000 | ORAL_TABLET | ORAL | Status: DC
  Administered 2015-03-25: 2 via ORAL
  Filled 2015-03-25: qty 2

## 2015-03-25 MED ORDER — EPHEDRINE 5 MG/ML INJ
10.0000 mg | INTRAVENOUS | Status: DC | PRN
Start: 2015-03-25 — End: 2015-03-25

## 2015-03-25 MED ORDER — LACTATED RINGERS IV SOLN
INTRAVENOUS | Status: DC
  Administered 2015-03-25: 08:00:00 via INTRAVENOUS

## 2015-03-25 MED ORDER — SODIUM CHLORIDE 0.9 % IJ SOLN: 3.0000 mL | INTRAMUSCULAR | Status: DC | PRN

## 2015-03-25 MED ORDER — OXYCODONE-ACETAMINOPHEN 5-325 MG PO TABS: 2.0000 | ORAL_TABLET | ORAL | Status: DC | PRN

## 2015-03-25 MED ORDER — LACTATED RINGERS IV SOLN: 500.0000 mL | INTRAVENOUS | Status: DC | PRN

## 2015-03-25 MED ORDER — BISACODYL 10 MG RE SUPP
10.0000 mg | Freq: Every day | RECTAL | Status: DC | PRN
  Filled 2015-03-25: qty 1

## 2015-03-25 MED ORDER — ONDANSETRON HCL 4 MG/2ML IJ SOLN: 4.0000 mg | Freq: Four times a day (QID) | INTRAMUSCULAR | Status: DC | PRN

## 2015-03-25 MED ORDER — FLEET ENEMA 7-19 GM/118ML RE ENEM: 1.0000 | ENEMA | Freq: Every day | RECTAL | Status: DC | PRN

## 2015-03-25 MED ORDER — CITRIC ACID-SODIUM CITRATE 334-500 MG/5ML PO SOLN
30.0000 mL | ORAL | Status: DC | PRN
Start: 2015-03-25 — End: 2015-03-25

## 2015-03-25 MED ORDER — EPHEDRINE 5 MG/ML INJ: 10.0000 mg | INTRAVENOUS | Status: DC | PRN

## 2015-03-25 MED ORDER — ACETAMINOPHEN 325 MG PO TABS: 650.0000 mg | ORAL_TABLET | ORAL | Status: DC | PRN

## 2015-03-25 MED ORDER — PHENYLEPHRINE 40 MCG/ML (10ML) SYRINGE FOR IV PUSH (FOR BLOOD PRESSURE SUPPORT): 80.0000 ug | PREFILLED_SYRINGE | INTRAVENOUS | Status: DC | PRN

## 2015-03-25 MED ORDER — WITCH HAZEL-GLYCERIN EX PADS: 1.0000 "application " | MEDICATED_PAD | CUTANEOUS | Status: DC | PRN

## 2015-03-25 MED ORDER — DIBUCAINE 1 % RE OINT
1.0000 "application " | TOPICAL_OINTMENT | RECTAL | Status: DC | PRN
  Filled 2015-03-25: qty 28

## 2015-03-25 NOTE — H&P (Signed)
LABOR ADMISSION HISTORY AND PHYSICAL  Theresa Owens is a 34 y.o. female [phone] with IUP at [redacted]w[redacted]d by LMP presenting for IOL 2/2 A1DM. She reports +FMs, No LOF, no VB, no blurry vision, headaches or peripheral edema, and RUQ pain.  She plans on breast and bottle feeding. She declines birth control.  Dating: By LMP c/w [redacted]w[redacted]d sono --->  Estimated Date of Delivery: 03/25/15  Prenatal History/Complications:  Past Medical History: Past Medical History  Diagnosis Date  . Vaginal Pap smear, abnormal   . Hypothyroidism   . Gestational diabetes mellitus, antepartum 12/30/2014  . Gestational diabetes     Past Surgical History: Past Surgical History  Procedure Laterality Date  . Thyroidectomy N/A     2006    Obstetrical History: OB History    Gravida Para Term Preterm AB TAB SAB Ectopic Multiple Living   4 2 2  0 1 0 1 0 0 2      Social History: History   Social History  . Marital Status: Single    Spouse Name: N/A  . Number of Children: N/A  . Years of Education: N/A   Social History Main Topics  . Smoking status: Never Smoker   . Smokeless tobacco: Never Used  . Alcohol Use: No  . Drug Use: No  . Sexual Activity: Yes   Other Topics Concern  . None   Social History Narrative    Family History: Family History  Problem Relation Age of Onset  . Hypertension Mother     Allergies: No Known Allergies  Prescriptions prior to admission  Medication Sig Dispense Refill Last Dose  . levothyroxine (SYNTHROID, LEVOTHROID) 100 MCG tablet Take 100 mcg by mouth daily before breakfast.   03/25/2015 at Unknown time  . Prenatal Vit-Fe Fumarate-FA (PRENATAL MULTIVITAMIN) TABS tablet Take 1 tablet by mouth daily at 12 noon.   Past Week at Unknown time  . ACCU-CHEK FASTCLIX LANCETS MISC Inject 1 each into the skin 4 (four) times daily. O24.419 for testing 4 times daily 102 each 12 Taking  . glucose blood (ACCU-CHEK SMARTVIEW) test strip DX Gestational Diabetes O24.419 for testing 4 times  daily 100 each 12 Taking     Review of Systems   All systems reviewed and negative except as stated in HPI  Blood pressure 128/80, pulse 64, temperature 98.4 F (36.9 C), temperature source Oral, resp. rate 18, height 4\' 11"  (1.499 m), weight 135 lb (61.236 kg), last menstrual period 06/18/2014. General appearance: alert and cooperative Lungs: clear to auscultation bilaterally Heart: regular rate and rhythm Abdomen: soft, non-tender; bowel sounds normal Extremities: Homans sign is negative, no sign of DVT Dilation: 4.5 Effacement (%): 70 Station: -2 Exam by:: A.Davis,RN   Prenatal labs: ABO, Rh: --/--/AB POS (01/11 1226) Antibody: NEG (01/11 1226) Rubella:   RPR: NON REAC (01/21 1523)  HBsAg: Negative (10/19 0000)  HIV: NONREACTIVE (01/21 1523)  GBS: Negative (03/28 0000)  1 hr Glucola abn Genetic screening  Not done Anatomy US normal   Clinic HD --> HRC Prenatal Labs  Dating LMP Blood type: --/--/AB POS (01/11 1226)  Genetic Screen 1 Screen:    AFP:     Quad:     NIPS: Antibody:NEG (01/11 1226)  Anatomic [place] normal Rubella: Immune (10/19 0000)  GTT 1h 158, 3h 97/194/145/170 RPR: Nonreactive (10/19 0000)   TDaP vaccine 12/24/14 HBsAg: Negative (10/19 0000)   Flu vaccine 12/06/14 HIV: Non-reactive (10/19 0000)   GBS Negative GBS: neg  Contraception Condoms Pap: nml 09/02/2012 per  HD records  Baby Food Breast and bottle   Circumcision Yes   Boy - AYDEN   Pediatrician [place] Person Boyfriend      Prenatal Transfer Tool  Maternal Diabetes: Yes:  Diabetes Type:  Diet controlled Genetic Screening: Declined Maternal Ultrasounds/Referrals: Normal Fetal Ultrasounds or other Referrals:  Referred to Materal Fetal Medicine 2/2 cervical insufficiency Maternal Substance Abuse:  No Significant Maternal Medications:  None Significant Maternal Lab Results: Lab values include: Group B Strep negative  No results found for this or any previous visit (from the  past 24 hour(s)).  Patient Active Problem List   Diagnosis Date Noted  . Pregnancy 03/25/2015  . Gestational diabetes mellitus, antepartum 12/30/2014  . Cervical shortening affecting pregnancy in second trimester 12/02/2014  . Hypothyroid in pregnancy, antepartum   . Insufficient prenatal care     Assessment: Theresa Owens is a 34 y.o. [phone] at [redacted]w[redacted]d here for IOL 2/2 A1DM well controlled with significant hx of cervical insufficiency  #Labor:pitocin given favorable cervix #Pain: Epidural upon request #FWB: Cat I #ID:  GBS neg #MOF: breast and bottle #MOC:declines  Amara Justen ROCIO 03/25/2015, 9:28 AM

## 2015-03-25 NOTE — Anesthesia Preprocedure Evaluation (Deleted)
Anesthesia Evaluation  Patient identified by MRN, date of birth, ID band Patient awake    Reviewed: Allergy & Precautions, H&P , Patient's Chart, lab work & pertinent test results  Airway Mallampati: II  TM Distance: >3 FB Neck ROM: full    Dental  (+) Teeth Intact   Pulmonary  breath sounds clear to auscultation        Cardiovascular Rhythm:regular Rate:Normal     Neuro/Psych    GI/Hepatic   Endo/Other  diabetes, GestationalHypothyroidism   Renal/GU      Musculoskeletal   Abdominal   Peds  Hematology   Anesthesia Other Findings       Reproductive/Obstetrics (+) Pregnancy                             Anesthesia Physical Anesthesia Plan  ASA: II  Anesthesia Plan: Epidural   Post-op Pain Management:    Induction:   Airway Management Planned:   Additional Equipment:   Intra-op Plan:   Post-operative Plan:   Informed Consent: I have reviewed the patients History and Physical, chart, labs and discussed the procedure including the risks, benefits and alternatives for the proposed anesthesia with the patient or authorized representative who has indicated his/her understanding and acceptance.   Dental Advisory Given  Plan Discussed with:   Anesthesia Plan Comments: (Labs checked- platelets confirmed with RN in room. Fetal heart tracing, per RN, reported to be stable enough for sitting procedure. Discussed epidural, and patient consents to the procedure:  included risk of possible headache,backache, failed block, allergic reaction, and nerve injury. This patient was asked if she had any questions or concerns before the procedure started.)        Anesthesia Quick Evaluation

## 2015-03-25 NOTE — Progress Notes (Signed)
Labor Progress Note  S: feeling some pain with contractions  O:  BP 139/78 mmHg  Pulse 65  Temp(Src) 98.4 F (36.9 C) (Oral)  Resp 20  Ht 4\' 11"  (1.499 m)  Wt 135 lb (61.236 kg)  BMI 27.25 kg/m2  LMP 06/18/2014 Cat I CVE: 6-7/90/1  A&P: 34 y.o. [id number] [redacted]w[redacted]d here for IOL 2/2 A1DM # labor: continue pitocin, AROM clear this exam with minimal fluid return # A1DM: CBG 126, will not start glucose stabilizer at this time.  Will follow up and start if indicated  Merla Riches, MD 1:37 PM

## 2015-03-26 ENCOUNTER — Ambulatory Visit: Payer: Self-pay

## 2015-03-26 MED ORDER — LEVOTHYROXINE SODIUM 100 MCG PO TABS
100.0000 ug | ORAL_TABLET | Freq: Every day | ORAL | Status: DC
  Administered 2015-03-26: 100 ug via ORAL
  Filled 2015-03-26 (×2): qty 1

## 2015-03-26 NOTE — Lactation Note (Signed)
This note was copied from the chart of [facility]. Lactation Consultation Note: Lactation Brochure given with review of breastfeeding basics. Mother states that she attempt to breastfeed her first 2 children, but was unsuccessful. Mother cradling infant and attempting to breastfeed.  Multiple attempts to latch infant. He takes a few sucks and then sucks on the back of his lips. Infant was spoon fed 5 ml of ebm. Mother was fit with a #20 nipple shield. Reviewed application of nipple shield.  Infant continued to be a difficult latch. Feedings that are recorded are attempts. Mother states that infant has not fed well. Mother was given a hand pump with instructions. She was advised to supplement infant with EBM. Discussed setting up a DEBP. Mother is discouraged and states she may just pump and bottle feed. Lots of support given . Mother advised to page for assistance with next feeding.   Patient Name: Boy Karyna Bessler [id number] Date: 03/26/2015 Reason for consult: Initial assessment   Maternal Data Has patient been taught Hand Expression?: Yes Does the patient have breastfeeding experience prior to this delivery?: Yes  Feeding Feeding Type: Breast Milk Length of feed: 20 min (on and off)  LATCH Score/Interventions Latch: Repeated attempts needed to sustain latch, nipple held in mouth throughout feeding, stimulation needed to elicit sucking reflex.  Audible Swallowing: A few with stimulation  Type of Nipple: Everted at rest and after stimulation  Comfort (Breast/Nipple): Soft / non-tender     Hold (Positioning): Assistance needed to correctly position infant at breast and maintain latch. Intervention(s): Breastfeeding basics reviewed;Support Pillows;Position options;Skin to skin  LATCH Score: 7  Lactation Tools Discussed/Used     Consult Status Consult Status: Follow-up Date: 03/26/15 Follow-up type: In-patient    Jess Barters [facility] 03/26/2015, 4:31 PM

## 2015-03-26 NOTE — Progress Notes (Signed)
UR chart review completed.  

## 2015-03-26 NOTE — Discharge Instructions (Signed)

## 2015-03-26 NOTE — Discharge Summary (Signed)
Obstetric Discharge Summary Reason for Admission: induction of labor Prenatal Procedures: ultrasound Intrapartum Procedures: spontaneous vaginal delivery Postpartum Procedures: none Complications-Operative and Postpartum: 2nd degree laceration  Patient is 34 y.o. A1P3790 [redacted]w[redacted]d admitted for IOL 2/2 A1DM, hx of cervical insufficiency with pessary placement current pregnancy   Delivery Note At 2:08 PM a viable female was delivered via (Presentation:[ROA] ; ). APGAR:[9] ,[9] ; weight[3530g] .  Placenta status:[intact] , . Cord: [3vessel] with the following complications:   Anesthesia: none Episiotomy: none Lacerations: 2nd degree Suture Repair: 3.0 vicryl rapide Est. Blood Loss (mL): 182mL  Mom to postpartum. Baby to Couplet care / Skin to Skin.  Hospital Course:  Active Problems:   NSVD (normal spontaneous vaginal delivery)   Theresa Owens is a 34 y.o. [id number] s/p SVD.  Patient was admitted IOL 2/2 A1DM.  She had a postpartum course that was uncomplicated including no problems with ambulating, PO intake, urination, pain, or bleeding. The patient feels ready to go home and will be discharged with outpatient follow-up.   Today: No acute events overnight.  Pt denies problems with ambulating, voiding or po intake.  She denies nausea or vomiting.  Pain is well controlled.  She has had flatus. She has not had bowel movement.  Lochia Large.  Plan for birth control is condoms.  Method of Feeding: Breast and Bottle  Physical Exam:  Blood pressure 107/60, pulse 71, temperature 98.3 F (36.8 C), temperature source Oral, resp. rate 18, height 4\' 11"  (1.499 m), weight 135 lb (61.236 kg), last menstrual period 06/18/2014, SpO2 100 %  General: alert, cooperative, appears stated age and no distress  Cardio: RRR, no m/r/g, +2DP Pulm: CTAB, no increased WOB Lochia: appropriate Uterine Fundus: firm DVT Evaluation: No evidence of DVT seen on physical exam. Negative Homan's sign. No cords  or calf tenderness. No significant calf/ankle edema.  H/H: Lab Results  Component Value Date/Time   HGB 12.7 03/25/2015 08:10 AM   HCT 37.6 03/25/2015 08:10 AM    Discharge Diagnoses: Term Pregnancy-delivered  Discharge Information: Date: 03/26/2015 Activity: pelvic rest Diet: routine  Medications: PNV and Ibuprofen Breast feeding:  Yes Condition: stable Instructions: refer to handout Discharge to: home      Medication List    STOP taking these medications        ACCU-CHEK FASTCLIX LANCETS Misc     glucose blood test strip  Commonly known as:  ACCU-CHEK SMARTVIEW      TAKE these medications        levothyroxine 100 MCG tablet  Commonly known as:  SYNTHROID, LEVOTHROID  Take 100 mcg by mouth daily before breakfast.     prenatal multivitamin Tabs tablet  Take 1 tablet by mouth daily at 12 noon.       Follow-up Information    Follow up with [facility]. Schedule an appointment as soon as possible for a visit in 6 weeks.   Specialty:  Obstetrics and Gynecology   Why:  6 week follow up   Contact information:   [place] [id number], [place], [facility], PGY-1 03/26/2015,7:24 AM  I have seen and examined this patient and agree the above assessment. CRESENZO-DISHMAN,Kori Goins 03/26/2015 8:09 AM

## 2015-03-27 ENCOUNTER — Ambulatory Visit: Payer: Self-pay

## 2015-03-27 NOTE — Lactation Note (Signed)
This note was copied from the chart of [facility]. Lactation Consultation Note  Observed mom attempting to latch baby using cross cradle hold.  Mom can easily hand express colostrum.  Baby is having difficulty sustaining latch.  Positioned baby in football hold but latch shallow even with good breast compression.  24 mm nipple shield used and baby latched deeper and nursed actively.  Discharge teaching done including treatment of engorgement.  Outpatient support and services reviewed and encouraged.  Patient Name: Theresa Owens Date: 03/27/2015 Reason for consult: Follow-up assessment   Maternal Data    Feeding Feeding Type: Breast Fed Length of feed: 20 min  LATCH Score/Interventions Latch: Grasps breast easily, tongue down, lips flanged, rhythmical sucking. (WITH NIPPLES SHIELD) Intervention(s): Adjust position;Assist with latch;Breast massage;Breast compression  Audible Swallowing: A few with stimulation Intervention(s): Skin to skin;Hand expression  Type of Nipple: Everted at rest and after stimulation  Comfort (Breast/Nipple): Soft / non-tender     Hold (Positioning): Assistance needed to correctly position infant at breast and maintain latch. Intervention(s): Breastfeeding basics reviewed;Support Pillows;Position options;Skin to skin  LATCH Score: 8  Lactation Tools Discussed/Used     Consult Status      [name] 03/27/2015, 9:28 AM

## 2015-04-29 ENCOUNTER — Encounter: Payer: Self-pay | Admitting: Medical

## 2015-04-29 ENCOUNTER — Ambulatory Visit (INDEPENDENT_AMBULATORY_CARE_PROVIDER_SITE_OTHER): Payer: Medicaid Other | Admitting: Medical

## 2015-04-29 NOTE — Patient Instructions (Signed)

## 2015-04-29 NOTE — Progress Notes (Signed)
Patient ID: Theresa Owens, female   DOB: 03-29-1981, 34 y.o.   MRN: [id number] Subjective:     Theresa Owens is a 34 y.o. female who presents for a postpartum visit. She is 5 weeks postpartum following a spontaneous vaginal delivery. I have fully reviewed the prenatal and intrapartum course. The delivery was at 40.0 gestational weeks. Outcome: spontaneous vaginal delivery. Anesthesia: local. Postpartum course has been normal. Baby's course has been normal. Baby is feeding by bottle - Similac Advance. Bleeding staining only. Bowel function is normal. Bladder function is normal. Patient is not sexually active. Contraception method is none. Postpartum depression screening: negative.  The following portions of the patient's history were reviewed and updated as appropriate: allergies, current medications, past family history, past medical history, past social history, past surgical history and problem list.  Review of Systems Pertinent items are noted in HPI.   Objective:    BP 123/87 mmHg  Pulse 74  Temp(Src) 98.2 F (36.8 C) (Core (Comment))  Wt 121 lb (54.885 kg)  Breastfeeding? No  General:  alert and cooperative   Breasts:  not performed  Lungs: clear to auscultation bilaterally  Heart:  regular rate and rhythm, S1, S2 normal, no murmur, click, rub or gallop  Abdomen: soft, non-tender; bowel sounds normal; no masses,  no organomegaly   Vulva:  normal  Vagina: normal vagina, no discharge, exudate, lesion, or erythema  Cervix:  multiparous appearring cervix with normal contour, no lesions noted  Corpus: normal size, contour, position, consistency, mobility, non-tender  Adnexa:  normal adnexa and no mass, fullness, tenderness  Rectal Exam: Not performed.        Assessment:     Normal postpartum exam. Pap smear not done at today's visit.  Next pap smear due 08/2015  Plan:    1. Contraception: condoms 2. Follow-up after 05/06/15 for 2 hour GTT with [facility]OC 3. Follow up in: 08/2015 with [facility] or [facility] for  annual exam or as needed.    Luvenia Redden, PA-C 04/29/2015 2:56 PM

## 2015-05-13 ENCOUNTER — Other Ambulatory Visit: Payer: Medicaid Other

## 2015-05-13 DIAGNOSIS — O24429 Gestational diabetes mellitus in childbirth, unspecified control: Secondary | ICD-10-CM

## 2015-05-14 ENCOUNTER — Telehealth: Payer: Self-pay | Admitting: General Practice

## 2015-05-14 ENCOUNTER — Encounter: Payer: Self-pay | Admitting: Obstetrics & Gynecology

## 2015-05-14 LAB — GLUCOSE TOLERANCE, 2 HOURS
Glucose, 2 hour: 99 mg/dL (ref 70–139)
Glucose, Fasting: 88 mg/dL (ref 70–99)

## 2015-05-14 NOTE — Telephone Encounter (Signed)
-----   Message from [place] L Presnell sent at 05/14/2015  8:26 AM EDT -----   ----- Message -----    From: Osborne Oman, MD    Sent: 05/14/2015   8:00 AM      To: [facility] Clinical Pool  Normal 2 hr GTT, patient does not currently have Type II DM.  Please call to inform patient of results.

## 2015-05-14 NOTE — Telephone Encounter (Signed)
Called patient and informed her of results. Patient verbalized understanding and had no questions. 

## 2015-07-13 IMAGING — US US MFM OB TRANSVAGINAL
1 series · 13 of 28 positions shown · non-contrast
Comparison: none

[Series 1: cl · 0.23mm/px · 31 acquisitions, 13 frames shown]
[im 2/31]
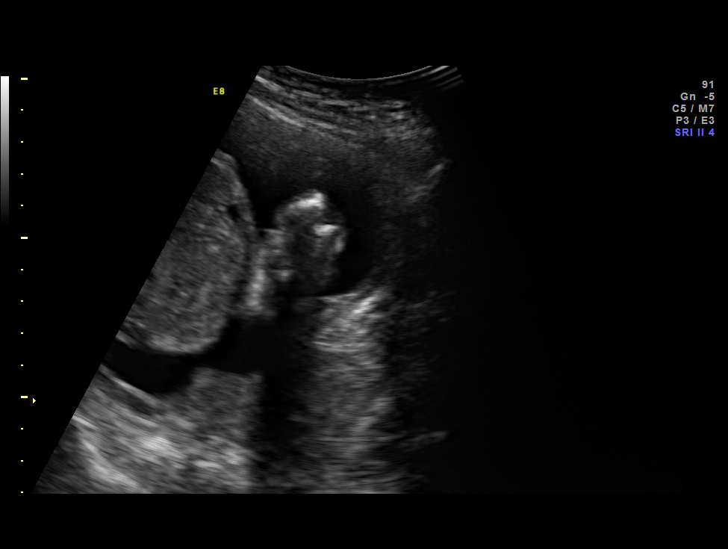
[im 4/31]
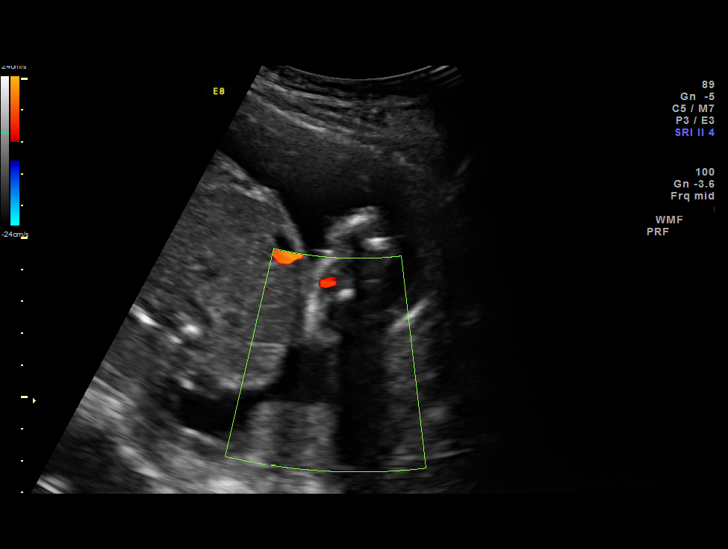
[im 6/31]
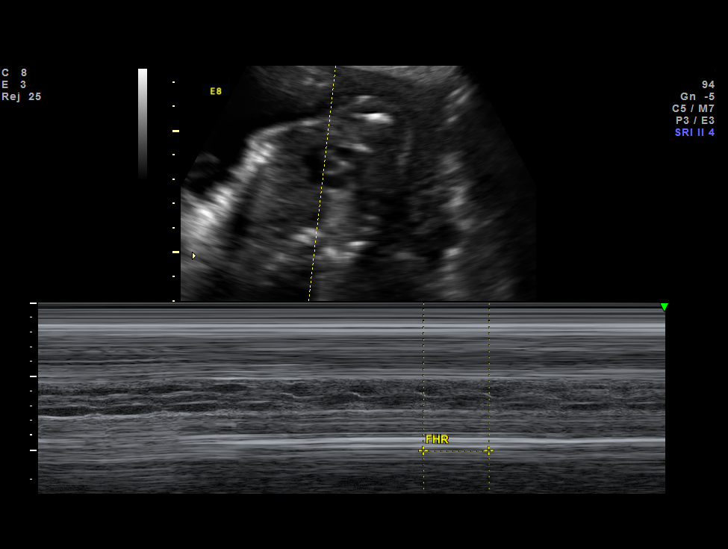
[im 8/31]
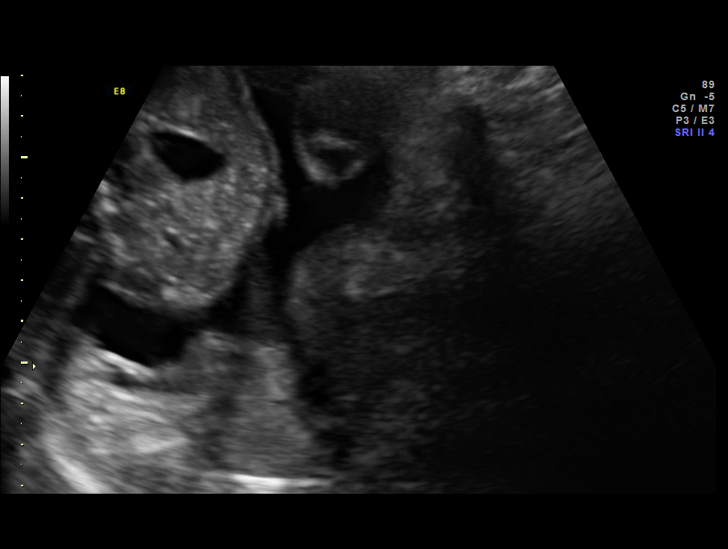
[im 11/31]
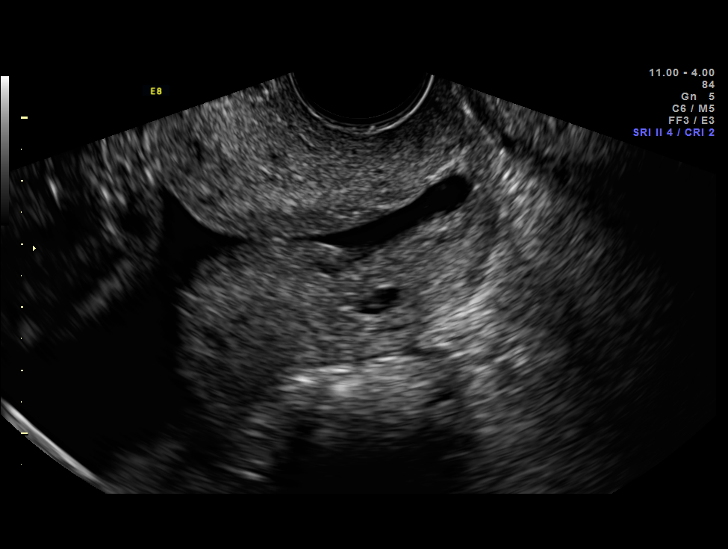
[im 13/31]
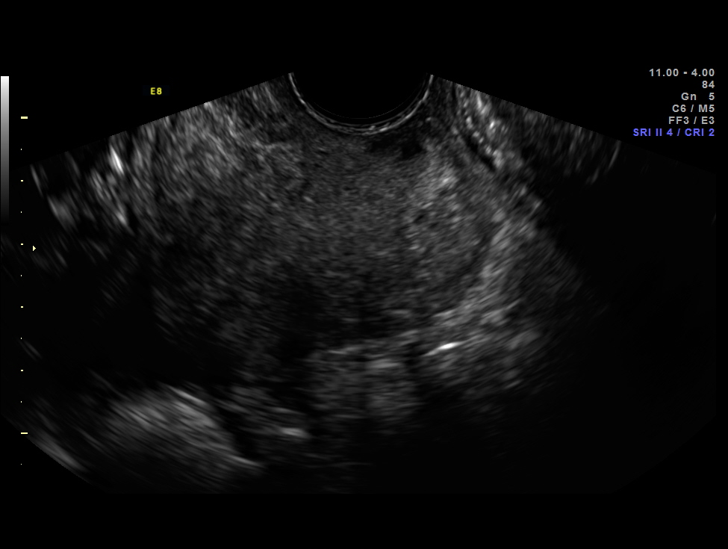
[im 16/31]
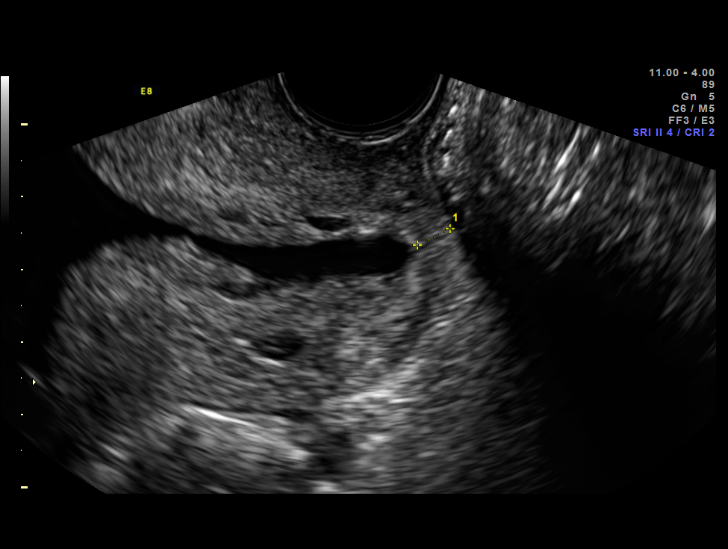
[im 18/31]
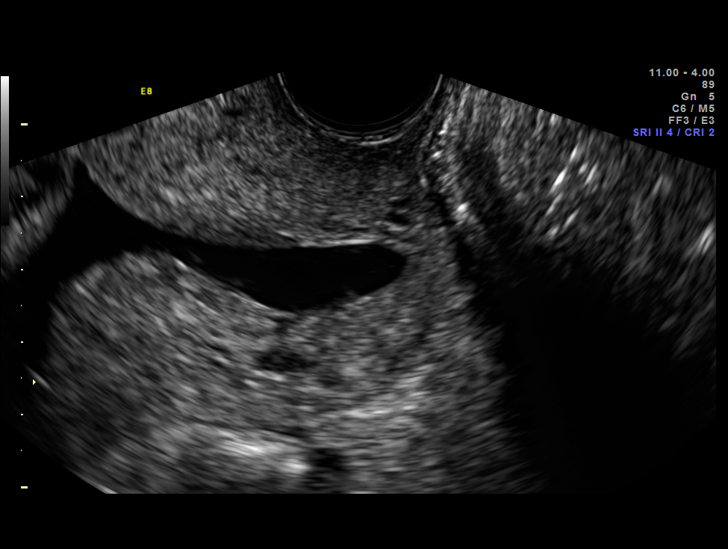
[im 21/31]
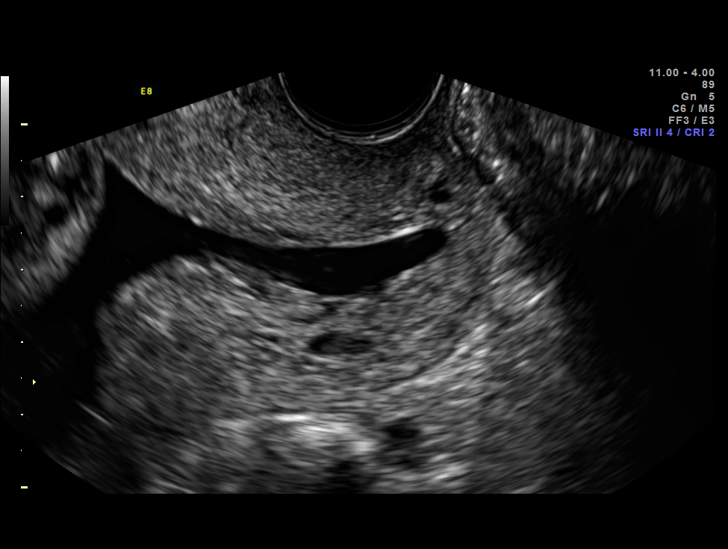
[im 23/31]
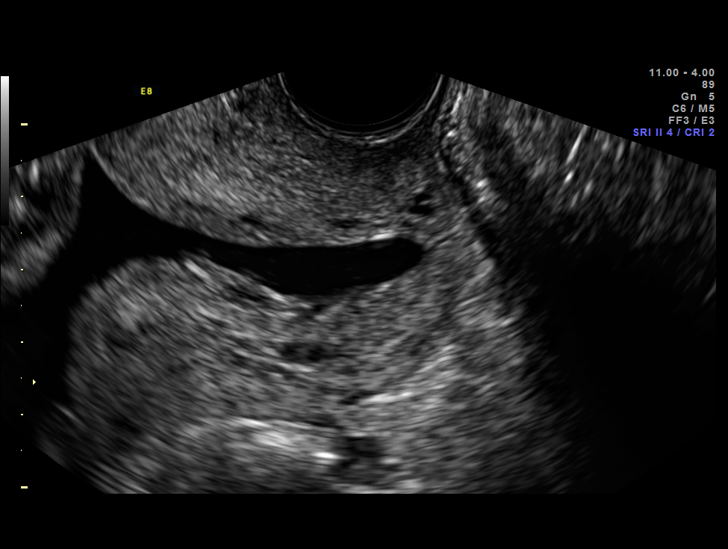
[im 25/31]
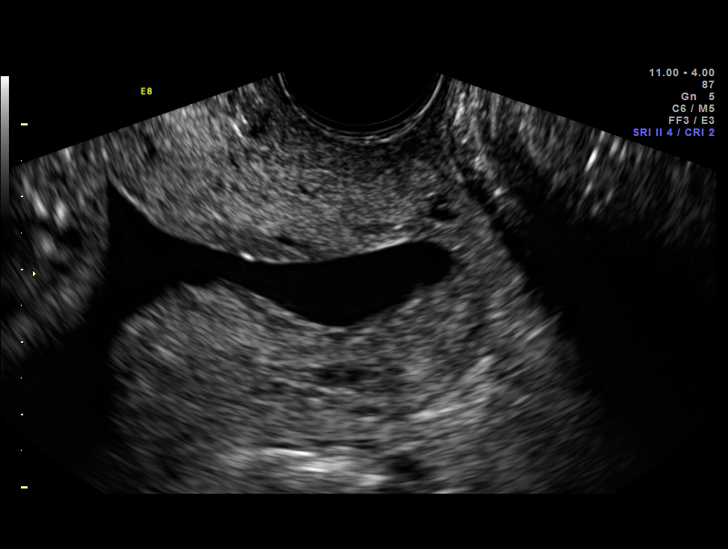
[im 27/31]
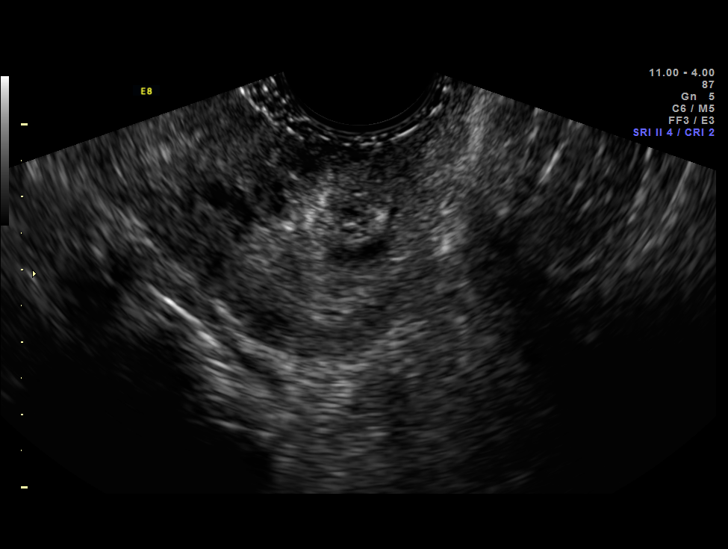
[im 29/31]
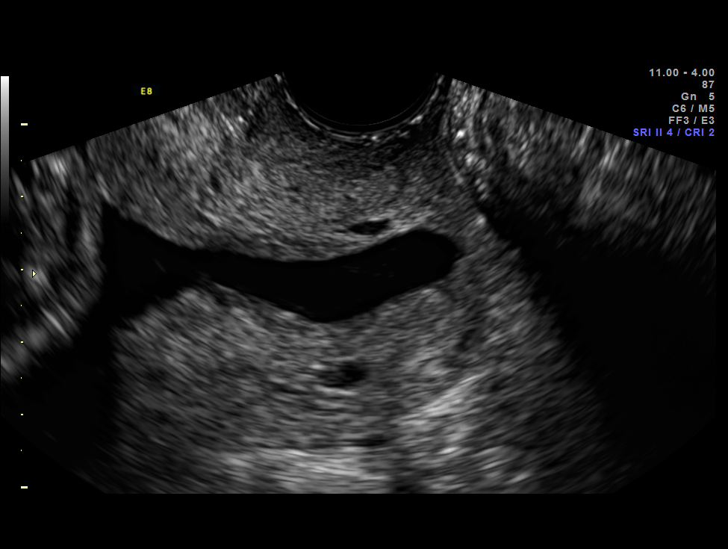

[13 of 28 positions shown; findings below may reference images not displayed]

OBSTETRICS REPORT
                      (Signed Final 12/07/2014 [DATE])

Service(s) Provided

 US MFM OB TRANSVAGINAL                                76817.2
Indications

 24 weeks gestation of pregnancy
 No or Little Prenatal Care
 Cervical shortening, 2nd (on progesterone)
 Hypothyroid (on synthroid)
Fetal Evaluation

 Num Of Fetuses:    1
 Fetal Heart Rate:  153                          bpm
 Cardiac Activity:  Observed
 Presentation:      Breech
Gestational Age

 LMP:           24w 4d        Date:  06/18/14                 EDD:   03/25/15
 Best:          24w 4d     Det. By:  LMP  (06/18/14)          EDD:   03/25/15
Cervix Uterus Adnexa

 Cervical Length:    0.35     cm

 Cervix:       Measured transvaginally. Funneling of internal os
               noted.
Impression

 Single IUP at 24w 4d
 Shortened cervix [place]/Marc-Oliver Jourdan
 Limited ultrasound performed for cervical length

 TVUS: V-shaped funneling that extends most of the length of
 the cervix.  A cervical length of 4-5 mm is noted (a least
 stable since admission)
Recommendations

 Activity limitations
 Vaginal progesterone
 Recommend follow up ultrasound for cervical length in 1 week
 questions or concerns.

## 2015-08-06 IMAGING — US US OB TRANSVAGINAL
1 series · 12 of 28 positions shown · non-contrast
Comparison: none

[Series 1: us ob transvaginal · 0.23mm/px · 12 of 59 slices shown]
[im 3/59]
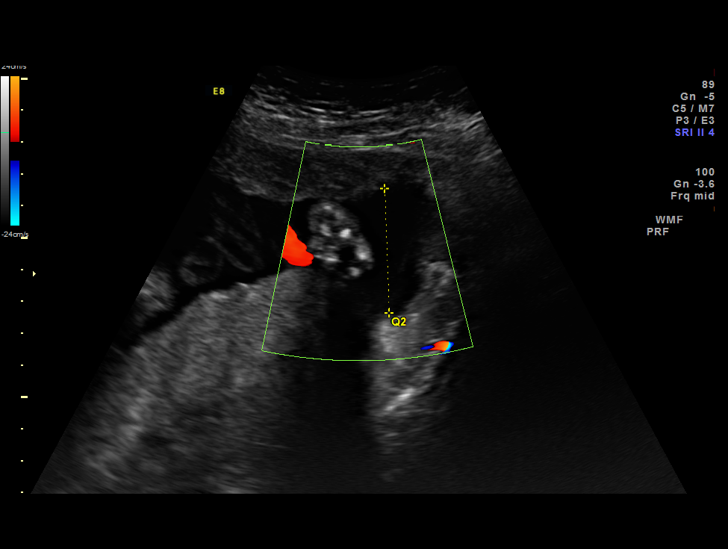
[im 7/59]
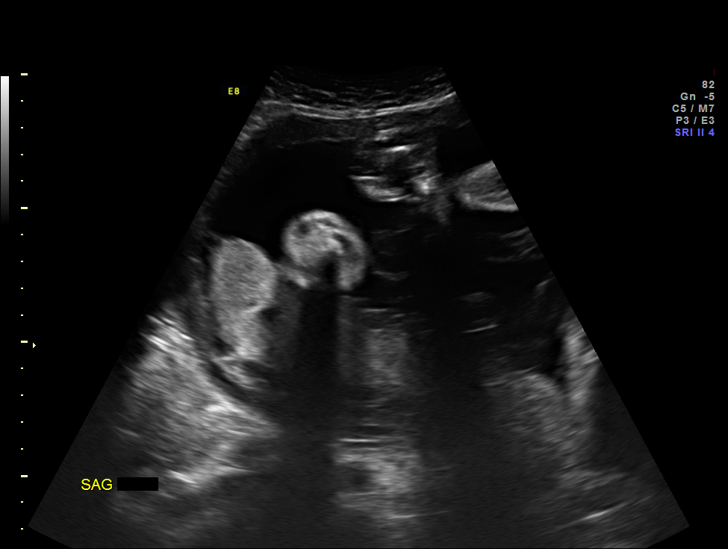
[im 11/59]
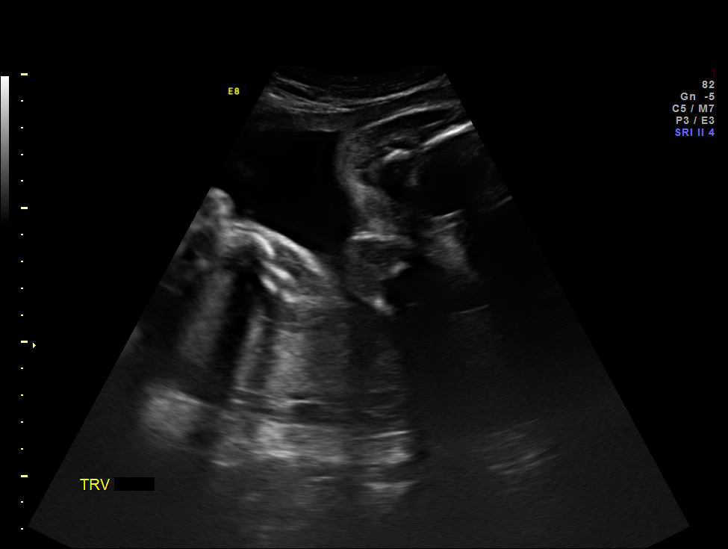
[im 18/59]
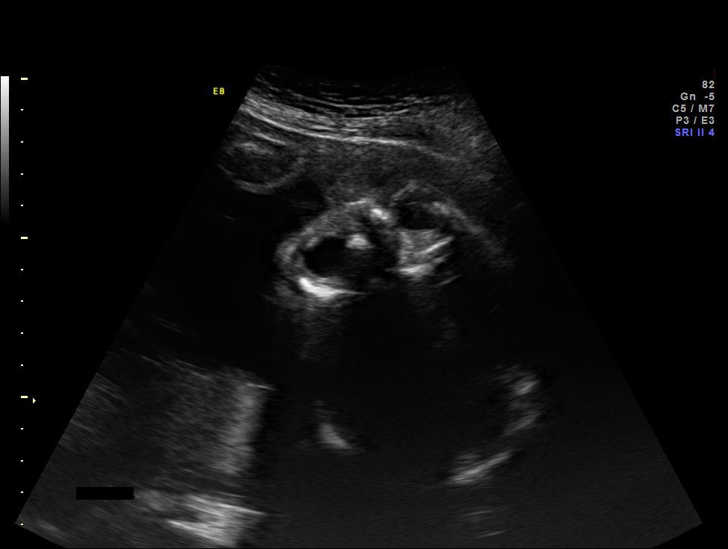
[im 22/59]
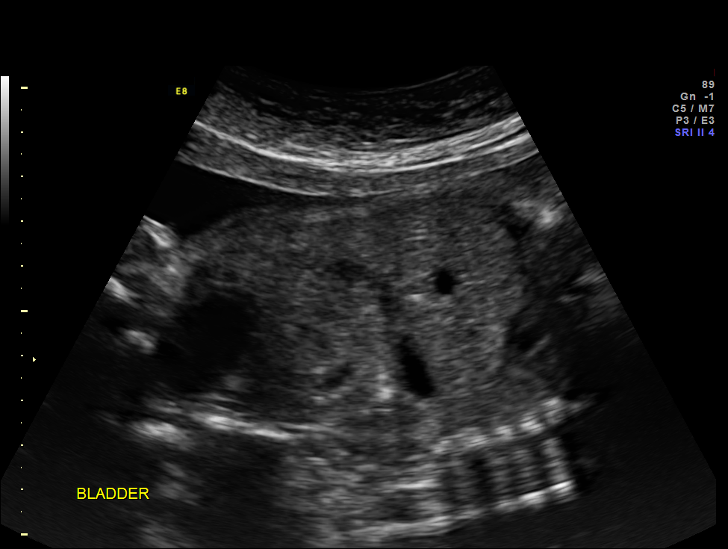
[im 26/59]
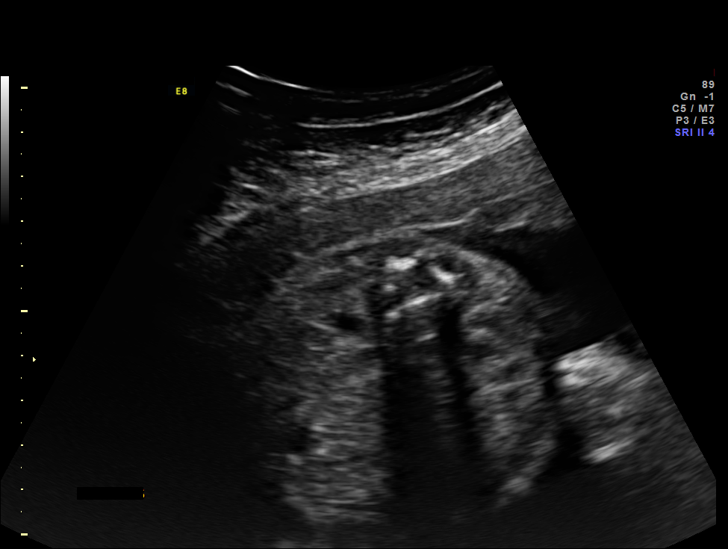
[im 33/59]
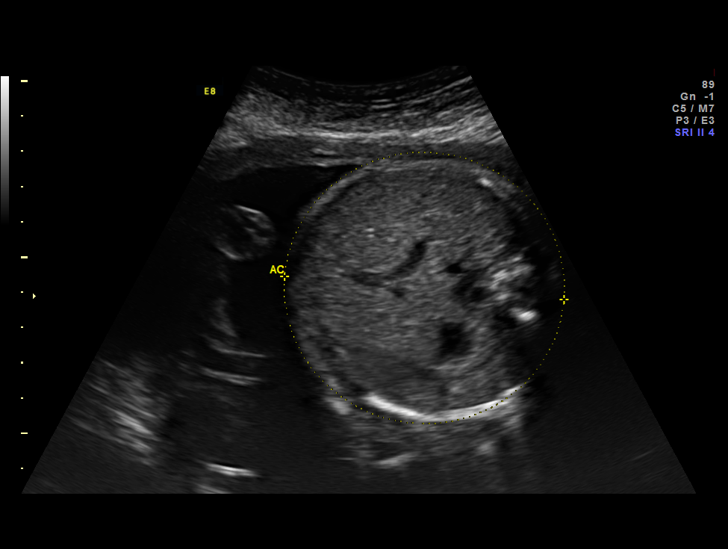
[im 37/59]
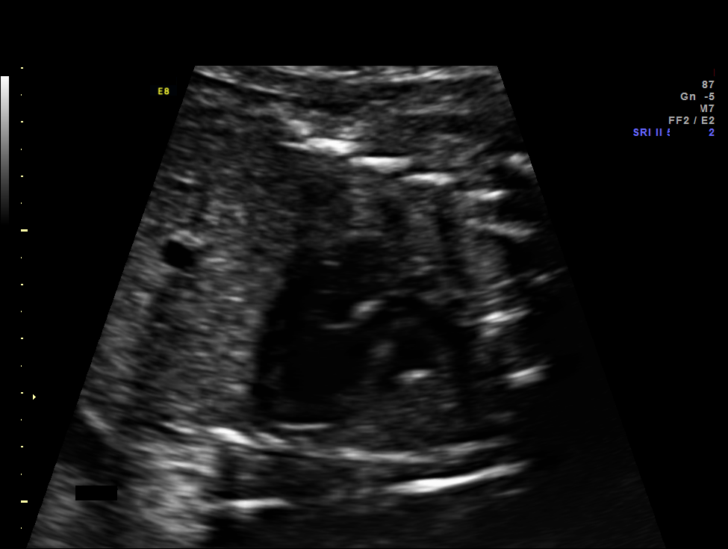
[im 41/59]
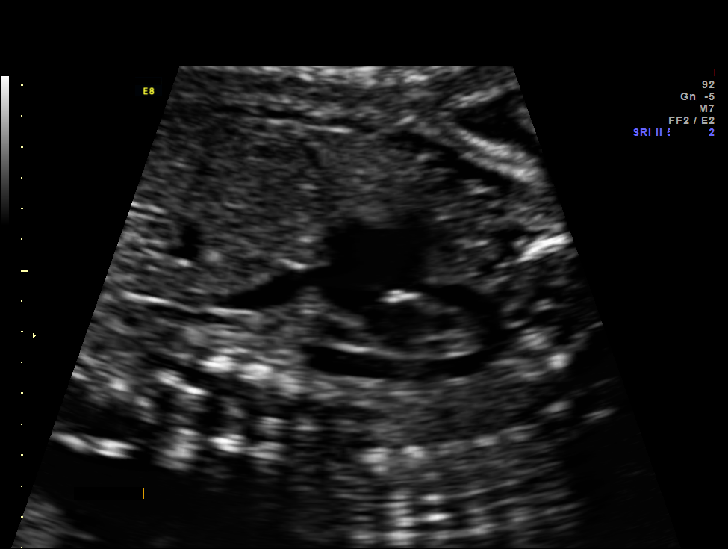
[im 48/59]
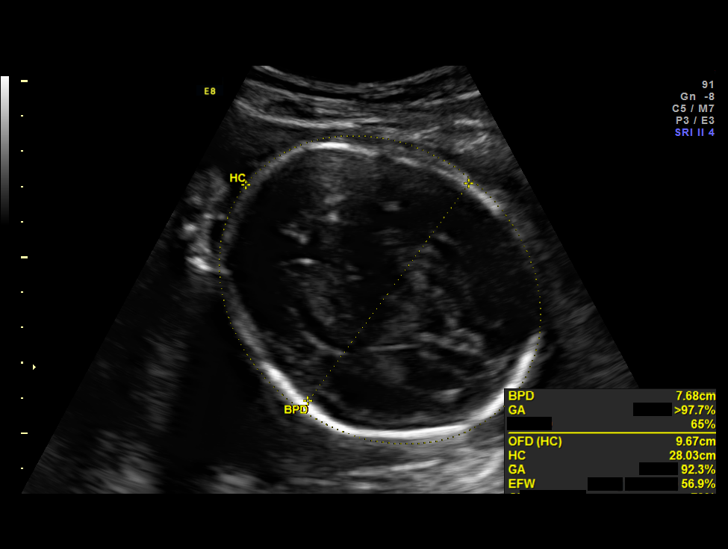
[im 52/59]
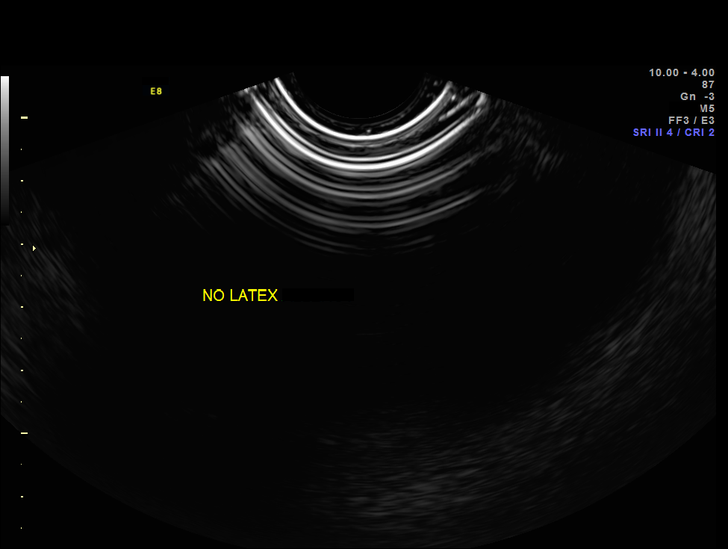
[im 56/59]
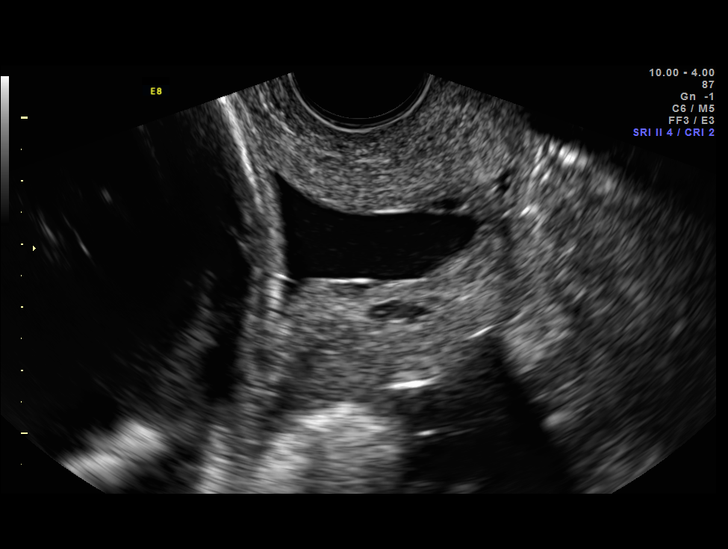

[12 of 28 positions shown; findings below may reference images not displayed]

OBSTETRICS REPORT
                      (Signed Final 12/31/2014 [DATE])

Service(s) Provided

 US OB TRANSVAGINAL                                    76817.0
 US OB FOLLOW UP                                       76816.1
Indications

 No or Little Prenatal Care
 Cervical shortening, 2nd (on progesterone/has
 pessary)
 Hypothyroid (on synthroid)
 28 weeks gestation of pregnancy
Fetal Evaluation

 Num Of Fetuses:    1
 Fetal Heart Rate:  147                          bpm
 Cardiac Activity:  Observed
 Presentation:      Cephalic
 Placenta:          Posterior, above cervical
                    os
 P. Cord            Visualized, central
 Insertion:

 Amniotic Fluid
 AFI FV:      Subjectively within normal limits
 AFI Sum:     14.47   cm       49  %Tile     Larg Pckt:    5.14  cm
 RUQ:   5.14    cm   RLQ:    2.78   cm    LUQ:   3.91    cm   LLQ:    2.64   cm
Biometry

 BPD:     77.1  mm     G. Age:  31w 0d                CI:         79.7   70 - 86
 OFD:     96.7  mm                                    FL/HC:      17.8   18.8 -

 HC:     279.4  mm     G. Age:  30w 4d       91  %    HC/AC:      1.14   1.05 -

 AC:     244.2  mm     G. Age:  28w 5d       63  %    FL/BPD:     64.6   71 - 87
 FL:      49.8  mm     G. Age:  26w 6d        9  %    FL/AC:      20.4   20 - 24
 HUM:     46.9  mm     G. Age:  27w 4d       38  %

 Est. FW:    6661  gm    2 lb 11 oz      59  %
Gestational Age

 LMP:           28w 0d        Date:  06/18/14                 EDD:   03/25/15
 U/S Today:     29w 2d                                        EDD:   03/16/15
 Best:          28w 0d     Det. By:  LMP  (06/18/14)          EDD:   03/25/15
Anatomy

 Cranium:          Appears normal         Aortic Arch:      Appears normal
 Fetal Cavum:      Previously seen        Ductal Arch:      Previously seen
 Ventricles:       Appears normal         Diaphragm:        Appears normal
 Choroid Plexus:   Appears normal         Stomach:          Appears normal, left
                                                            sided
 Cerebellum:       Appears normal         Abdomen:          Appears normal
 Posterior Fossa:  Appears normal         Abdominal Wall:   Appears nml (cord
                                                            insert, abd wall)
 Nuchal Fold:      Previously seen        Cord Vessels:     Appears normal (3
                                                            vessel cord)
 Face:             Appears normal         Kidneys:          Appear normal
                   (orbits and profile)
 Lips:             Appears normal         Bladder:          Appears normal
 Heart:            Appears normal         Spine:            Previously seen
                   (4CH, axis, and
                   situs)
 RVOT:             Appears normal         Lower             Previously seen
                                          Extremities:
 LVOT:             Appears normal         Upper             Previously seen
                                          Extremities:

 Other:  Male gender previously seen. Heels and 5th digit previously
         visualized.
Cervix Uterus Adnexa

 Cervical Length:    0.5      cm       Funnel Width:   1.39      cm
 Funnel Length:      2.84     cm

 Cervix:       Measured transvaginally.
 Uterus:       No abnormality visualized.
 Cul De Sac:   No free fluid seen.

 Left Ovary:    Not visualized.
 Right Ovary:   Not visualized.
 Adnexa:     No abnormality visualized.
Impression

 Single IUP at 28w 0d
 Follow up due to known shortened cervix - currently on
 progesterone / cervical pessary
 Normal interval growth (59th %tile)
 Posterior placenta
 Normal amniotic fluid volume

 TVUS - cervical length 5-6 mm (essentially stable).  V-shaped
 funneling that extends most of the length of the cervix
Recommendations

 Recommend follow-up ultrasound examination in 2 weeks for
 cervical length - if stable at that time, would not continue
 cervical length surveillance
 Continue vaginal progesterone/ activity limitations

 questions or concerns.

## 2015-11-03 ENCOUNTER — Encounter: Payer: Self-pay | Admitting: Obstetrics and Gynecology

## 2015-11-03 ENCOUNTER — Other Ambulatory Visit (HOSPITAL_COMMUNITY)
Admission: RE | Admit: 2015-11-03 | Discharge: 2015-11-03 | Disposition: A | Discharge status: Home / Self Care | Payer: Medicaid Other | Source: Ambulatory Visit | Attending: Obstetrics and Gynecology | Admitting: Obstetrics and Gynecology

## 2015-11-03 ENCOUNTER — Ambulatory Visit (INDEPENDENT_AMBULATORY_CARE_PROVIDER_SITE_OTHER): Payer: Medicaid Other | Admitting: Obstetrics and Gynecology

## 2015-11-03 VITALS — BP 119/82 | HR 69 | Temp 97.8°F | Ht 60.0 in | Wt 128.3 lb

## 2015-11-03 DIAGNOSIS — Z113 Encounter for screening for infections with a predominantly sexual mode of transmission: Secondary | ICD-10-CM | POA: Diagnosis present

## 2015-11-03 DIAGNOSIS — Z1151 Encounter for screening for human papillomavirus (HPV): Secondary | ICD-10-CM | POA: Insufficient documentation

## 2015-11-03 DIAGNOSIS — Z23 Encounter for immunization: Secondary | ICD-10-CM

## 2015-11-03 DIAGNOSIS — Z01419 Encounter for gynecological examination (general) (routine) without abnormal findings: Secondary | ICD-10-CM

## 2015-11-03 DIAGNOSIS — Z124 Encounter for screening for malignant neoplasm of cervix: Secondary | ICD-10-CM | POA: Diagnosis not present

## 2015-11-03 NOTE — Progress Notes (Signed)
  Subjective:     Fania Roose is a 34 y.o. female [phone] who is here for a comprehensive physical exam. The patient reports no problems. She is sexually active using condoms for contraception. She reports monthly 5-7 day cycles Past Medical History  Diagnosis Date  . Hypothyroidism   . History of gestational diabetes     Normal postpartum 2 hr GTT   Past Surgical History  Procedure Laterality Date  . Thyroidectomy N/A     2006   Family History  Problem Relation Age of Onset  . Hypertension Mother    Social History   Social History  . Marital Status: Single    Spouse Name: N/A  . Number of Children: N/A  . Years of Education: N/A   Occupational History  . Not on file.   Social History Main Topics  . Smoking status: Never Smoker   . Smokeless tobacco: Never Used  . Alcohol Use: No  . Drug Use: No  . Sexual Activity: Yes    Birth Control/ Protection: Condom   Other Topics Concern  . Not on file   Social History Narrative   Health Maintenance  Topic Date Due  . HEMOGLOBIN A1C  Jul 03, 1983  . PNEUMOCOCCAL POLYSACCHARIDE VACCINE (1) 11/17/1985  . FOOT EXAM  11/17/1993  . OPHTHALMOLOGY EXAM  11/17/1993  . URINE MICROALBUMIN  11/17/1993  . PAP SMEAR  11/17/2004  . INFLUENZA VACCINE  07/05/2015  . TETANUS/TDAP  12/24/2024  . HIV Screening  Completed       Review of Systems Pertinent items are noted in HPI.   Objective:     Blood pressure 119/82, pulse 69, temperature 97.8 F (36.6 C), temperature source Oral, height 5' (1.524 m), weight 128 lb 4.8 oz (58.196 kg), last menstrual period 10/27/2015, not currently breastfeeding. GENERAL: Well-developed, well-nourished female in no acute distress.  HEENT: Normocephalic, atraumatic. Sclerae anicteric.  NECK: Supple. Normal thyroid.  LUNGS: Clear to auscultation bilaterally.  HEART: Regular rate and rhythm. BREASTS: Symmetric in size. No palpable masses or lymphadenopathy, skin changes, or nipple drainage. ABDOMEN:  Soft, nontender, nondistended. No organomegaly. PELVIC: Normal external female genitalia. Vagina is pink and rugated.  Normal discharge. Normal appearing cervix. Uterus is normal in size. No adnexal mass or tenderness. EXTREMITIES: No cyanosis, clubbing, or edema, 2+ distal pulses.    Assessment:    Healthy female exam.      Plan:    pap smear collected Patient desires full STD testing which has been ordered Flu vaccine today Patient will be contacted with any abnormal results See After Visit Summary for Counseling Recommendations

## 2015-11-03 NOTE — Addendum Note (Signed)
Addended by: Riccardo Dubin on: 11/03/2015 03:49 PM   Modules accepted: Orders

## 2015-11-04 LAB — GC/CHLAMYDIA PROBE AMP (~~LOC~~) NOT AT ARMC
CHLAMYDIA, DNA PROBE: NEGATIVE
Neisseria Gonorrhea: NEGATIVE

## 2015-11-04 LAB — RPR

## 2015-11-04 LAB — HEPATITIS B SURFACE ANTIGEN: Hepatitis B Surface Ag: NEGATIVE

## 2015-11-04 LAB — HEPATITIS C ANTIBODY: HCV AB: NEGATIVE

## 2015-11-04 LAB — CYTOLOGY - PAP

## 2015-11-04 LAB — HIV ANTIBODY (ROUTINE TESTING W REFLEX): HIV 1&2 Ab, 4th Generation: NONREACTIVE

## 2016-04-21 DIAGNOSIS — C73 Malignant neoplasm of thyroid gland: Secondary | ICD-10-CM | POA: Insufficient documentation

## 2016-04-21 DIAGNOSIS — E890 Postprocedural hypothyroidism: Secondary | ICD-10-CM | POA: Insufficient documentation

## 2017-12-04 NOTE — L&D Delivery Note (Addendum)
Delivery Note At 12:31 AM a viable female was delivered via Vaginal, Spontaneous (Presentation: cephalic, LOA ).  APGAR: 6, 9;   Placenta status: spontaneous, intact.  Cord: 3VC    Anesthesia:  No epidural Episiotomy: None Lacerations: None Suture Repair: none Est. Blood Loss (mL): 75cc  Mom to postpartum.  Baby to Couplet care / Skin to Skin.  Upon arrival patient was complete and pushing. She pushed with good maternal effort to deliver a vigorous baby girl. Baby was delivered with minor difficulty following the delivery of the head there was a roughly 10 second delay before completion of the delivery which seemed to be due to a narrow introitus. Baby was placed on maternal abdomen for oral suctioning, drying and stimulation. Cord was clamped without delay due to the weak cry/poor responsiveness of baby. Placenta delivered intact with 3V cord. Vaginal canal and perineum was inspected and was found to be without lacerations and was  hemostatic. Pitocin was started and uterus massaged until bleeding slowed. Counts of sharps, instruments, and lap pads were all correct.   Matilde Haymaker, MD, PGY-1 08/07/2018, 1:48 AM  Attestation: I have seen this patient and agree with the resident's documentation. I was present for the entire delivery and assisted with delivery of the head. Infant with lower HR requiring further stimulation and blow-by oxygen in the warmer.   Lambert Mody. Juleen China, DO OB/GYN Fellow

## 2018-01-02 ENCOUNTER — Ambulatory Visit (INDEPENDENT_AMBULATORY_CARE_PROVIDER_SITE_OTHER): Payer: Self-pay | Admitting: *Deleted

## 2018-01-02 ENCOUNTER — Encounter: Payer: Self-pay | Admitting: *Deleted

## 2018-01-02 DIAGNOSIS — Z3480 Encounter for supervision of other normal pregnancy, unspecified trimester: Secondary | ICD-10-CM

## 2018-01-02 DIAGNOSIS — Z3201 Encounter for pregnancy test, result positive: Secondary | ICD-10-CM

## 2018-01-02 LAB — POCT PREGNANCY, URINE: PREG TEST UR: POSITIVE — AB

## 2018-01-02 NOTE — Progress Notes (Signed)
Here for pregnancy test which was positive. States LMP 11/29/17. States not sure if it was that exact date, but was around that day in the end of December. Would like to start care here.

## 2018-01-16 NOTE — Progress Notes (Signed)
Agree w./ POC.  Tamala Julian, [place], [place] 01/16/2018 9:10 AM

## 2018-02-11 ENCOUNTER — Other Ambulatory Visit: Payer: Medicaid Other

## 2018-02-11 DIAGNOSIS — Z3480 Encounter for supervision of other normal pregnancy, unspecified trimester: Secondary | ICD-10-CM

## 2018-02-18 ENCOUNTER — Ambulatory Visit (INDEPENDENT_AMBULATORY_CARE_PROVIDER_SITE_OTHER): Payer: Medicaid Other | Admitting: Advanced Practice Midwife

## 2018-02-18 ENCOUNTER — Other Ambulatory Visit (HOSPITAL_COMMUNITY)
Admission: RE | Admit: 2018-02-18 | Discharge: 2018-02-18 | Disposition: A | Discharge status: Home / Self Care | Payer: Medicaid Other | Source: Ambulatory Visit | Attending: Advanced Practice Midwife | Admitting: Advanced Practice Midwife

## 2018-02-18 ENCOUNTER — Encounter: Payer: Self-pay | Admitting: Advanced Practice Midwife

## 2018-02-18 DIAGNOSIS — Z3480 Encounter for supervision of other normal pregnancy, unspecified trimester: Secondary | ICD-10-CM | POA: Diagnosis not present

## 2018-02-18 DIAGNOSIS — Z3481 Encounter for supervision of other normal pregnancy, first trimester: Secondary | ICD-10-CM | POA: Diagnosis not present

## 2018-02-18 DIAGNOSIS — Z85850 Personal history of malignant neoplasm of thyroid: Secondary | ICD-10-CM | POA: Diagnosis not present

## 2018-02-18 DIAGNOSIS — Z23 Encounter for immunization: Secondary | ICD-10-CM

## 2018-02-18 LAB — OBSTETRIC PANEL, INCLUDING HIV
Antibody Screen: NEGATIVE
BASOS ABS: 0 10*3/uL (ref 0.0–0.2)
Basos: 0 %
EOS (ABSOLUTE): 0.1 10*3/uL (ref 0.0–0.4)
Eos: 2 %
HEP B S AG: NEGATIVE
HIV SCREEN 4TH GENERATION: NONREACTIVE
Hematocrit: 37.2 % (ref 34.0–46.6)
Hemoglobin: 12.1 g/dL (ref 11.1–15.9)
IMMATURE GRANS (ABS): 0 10*3/uL (ref 0.0–0.1)
Immature Granulocytes: 0 %
LYMPHS: 26 %
Lymphocytes Absolute: 1.9 10*3/uL (ref 0.7–3.1)
MCH: 27.9 pg (ref 26.6–33.0)
MCHC: 32.5 g/dL (ref 31.5–35.7)
MCV: 86 fL (ref 79–97)
MONOCYTES: 4 %
Monocytes Absolute: 0.3 10*3/uL (ref 0.1–0.9)
NEUTROS ABS: 4.9 10*3/uL (ref 1.4–7.0)
Neutrophils: 68 %
PLATELETS: 186 10*3/uL (ref 150–379)
RBC: 4.33 x10E6/uL (ref 3.77–5.28)
RDW: 13.6 % (ref 12.3–15.4)
RPR: NONREACTIVE
RUBELLA: 2.66 {index} (ref >0.99)
Rh Factor: POSITIVE
WBC: 7.2 10*3/uL (ref 3.4–10.8)

## 2018-02-18 LAB — SMN1 COPY NUMBER ANALYSIS (SMA CARRIER SCREENING)

## 2018-02-18 LAB — POCT URINALYSIS DIP (DEVICE)
Bilirubin Urine: NEGATIVE
GLUCOSE, UA: NEGATIVE mg/dL
Hgb urine dipstick: NEGATIVE
Ketones, ur: NEGATIVE mg/dL
NITRITE: NEGATIVE
PROTEIN: 30 mg/dL — AB
SPECIFIC GRAVITY, URINE: 1.015 (ref 1.005–1.030)
UROBILINOGEN UA: 0.2 mg/dL (ref 0.0–1.0)
pH: 8.5 — ABNORMAL HIGH (ref 5.0–8.0)

## 2018-02-18 NOTE — Patient Instructions (Signed)
Safe Medications in Pregnancy   Acne: Benzoyl Peroxide Salicylic Acid  Backache/Headache: Tylenol: 2 regular strength every 4 hours OR              2 Extra strength every 6 hours  Colds/Coughs/Allergies: Benadryl (alcohol free) 25 mg every 6 hours as needed Breath right strips Claritin Cepacol throat lozenges Chloraseptic throat spray Cold-Eeze- up to three times per day Cough drops, alcohol free Flonase (by prescription only) Guaifenesin Mucinex Robitussin DM (plain only, alcohol free) Saline nasal spray/drops Sudafed (pseudoephedrine) & Actifed ** use only after [redacted] weeks gestation and if you do not have high blood pressure Tylenol Vicks Vaporub Zinc lozenges Zyrtec   Constipation: Colace Ducolax suppositories Fleet enema Glycerin suppositories Metamucil Milk of magnesia Miralax Senokot Smooth move tea  Diarrhea: Kaopectate Imodium A-D  *NO pepto Bismol  Hemorrhoids: Anusol Anusol HC Preparation H Tucks  Indigestion: Tums Maalox Mylanta Zantac  Pepcid  Insomnia: Benadryl (alcohol free) 25mg every 6 hours as needed Tylenol PM Unisom, no Gelcaps  Leg Cramps: Tums MagGel  Nausea/Vomiting:  Bonine Dramamine Emetrol Ginger extract Sea bands Meclizine  Nausea medication to take during pregnancy:  Unisom (doxylamine succinate 25 mg tablets) Take one tablet daily at bedtime. If symptoms are not adequately controlled, the dose can be increased to a maximum recommended dose of two tablets daily (1/2 tablet in the morning, 1/2 tablet mid-afternoon and one at bedtime). Vitamin B6 100mg tablets. Take one tablet twice a day (up to 200 mg per day).  Skin Rashes: Aveeno products Benadryl cream or 25mg every 6 hours as needed Calamine Lotion 1% cortisone cream  Yeast infection: Gyne-lotrimin 7 Monistat 7   **If taking multiple medications, please check labels to avoid duplicating the same active ingredients **take medication as directed on  the label ** Do not exceed 4000 mg of tylenol in 24 hours **Do not take medications that contain aspirin or ibuprofen   Childbirth Education Options: [facility] Classes:  Childbirth education classes can help you get ready for a positive parenting experience. You can also meet other expectant parents and get free stuff for your baby. Each class runs for five weeks on the same night and costs $45 for the mother-to-be and her support person. Medicaid covers the cost if you are eligible. Call 336-641-4718 to register. [facility] Childbirth Education:  [phone] or 336-832-6848 or [email or web address]  Baby & Me Class: Discuss newborn & infant parenting and family adjustment issues with other new mothers in a relaxed environment. Each week brings a new speaker or baby-centered activity. We encourage new mothers to join us every Thursday at 11:00am. Babies birth until crawling. No registration or fee. Daddy Boot Camp: This course offers Dads-to-be the tools and knowledge needed to feel confident on their journey to becoming new fathers. Experienced dads, who have been trained as coaches, teach dads-to-be how to hold, comfort, diaper, swaddle and play with their infant while being able to support the new mom as well. A class for men taught by men. $25/dad Big Brother/Big Sister: Let your children share in the joy of a new brother or sister in this special class designed just for them. Class includes discussion about how families care for babies: swaddling, holding, diapering, safety as well as how they can be helpful in their new role. This class is designed for children ages 2 to 6, but any age is welcome. Please register each child individually. $5/child  Mom Talk: This mom-led group offers support and connection to   mothers as they journey through the adjustments and struggles of that sometimes overwhelming first year after the birth of a child. Tuesdays at 10:00am and  Thursdays at 6:00pm. Babies welcome. No registration or fee. Breastfeeding Support Group: This group is a mother-to-mother support circle where moms have the opportunity to share their breastfeeding experiences. A Lactation Consultant is present for questions and concerns. Meets each Tuesday at 11:00am. No fee or registration. Breastfeeding Your Baby: Learn what to expect in the first days of breastfeeding your newborn.  This class will help you feel more confident with the skills needed to begin your breastfeeding experience. Many new mothers are concerned about breastfeeding after leaving the hospital. This class will also address the most common fears and challenges about breastfeeding during the first few weeks, months and beyond. (call for fee) Comfort Techniques and Tour: This 2 hour interactive class will provide you the opportunity to learn & practice hands-on techniques that can help relieve some of the discomfort of labor and encourage your baby to rotate toward the best position for birth. You and your partner will be able to try a variety of labor positions with birth balls and rebozos as well as practice breathing, relaxation, and visualization techniques. A tour of the [facility] is included with this class. $20 per registrant and support person Childbirth Class- Weekend Option: This class is a Weekend version of our Birth & Baby series. It is designed for parents who have a difficult time fitting several weeks of classes into their schedule. It covers the care of your newborn and the basics of labor and childbirth. It also includes a Maternity Care Center Tour of [facility] and lunch. The class is held two consecutive days: beginning on Friday evening from 6:30 - 8:30 p.m. and the next day, Saturday from 9 a.m. - 4 p.m. (call for fee) Waterbirth Class: Interested in a waterbirth?  This informational class will help you discover whether waterbirth is the right fit  for you. Education about waterbirth itself, supplies you would need and how to assemble your support team is what you can expect from this class. Some obstetrical practices require this class in order to pursue a waterbirth. (Not all obstetrical practices offer waterbirth-check with your healthcare provider.) Register only the expectant mom, but you are encouraged to bring your partner to class! Required if planning waterbirth, no fee. Infant/Child CPR: Parents, grandparents, babysitters, and friends learn Cardio-Pulmonary Resuscitation skills for infants and children. You will also learn how to treat both conscious and unconscious choking in infants and children. This Family & Friends program does not offer certification. Register each participant individually to ensure that enough mannequins are available. (Call for fee) Grandparent Love: Expecting a grandbaby? This class is for you! Learn about the latest infant care and safety recommendations and ways to support your own child as he or she transitions into the parenting role. Taught by Registered Nurses who are childbirth instructors, but most importantly...they are grandmothers too! $10/person. Childbirth Class- Natural Childbirth: This series of 5 weekly classes is for expectant parents who want to learn and practice natural methods of coping with the process of labor and childbirth. Relaxation, breathing, massage, visualization, role of the partner, and helpful positioning are highlighted. Participants learn how to be confident in their body's ability to give birth. This class will empower and help parents make informed decisions about their own care. Includes discussion that will help new parents transition into the immediate postpartum period. Maternity   Care Center Tour of [facility] is included. We suggest taking this class between 25-32 weeks, but it's only a recommendation. $75 per registrant and one support person or $30 Medicaid. Childbirth  Class- 3 week Series: This option of 3 weekly classes helps you and your labor partner prepare for childbirth. Newborn care, labor & birth, cesarean birth, pain management, and comfort techniques are discussed and a Maternity Care Center Tour of [facility] is included. The class meets at the same time, on the same day of the week for 3 consecutive weeks beginning with the starting date you choose. $60 for registrant and one support person.  Marvelous Multiples: Expecting twins, triplets, or more? This class covers the differences in labor, birth, parenting, and breastfeeding issues that face multiples' parents. NICU tour is included. Led by a Certified Childbirth Educator who is the mother of twins. No fee. Caring for Baby: This class is for expectant and adoptive parents who want to learn and practice the most up-to-date newborn care for their babies. Focus is on birth through the first six weeks of life. Topics include feeding, bathing, diapering, crying, umbilical cord care, circumcision care and safe sleep. Parents learn to recognize symptoms of illness and when to call the pediatrician. Register only the mom-to-be and your partner or support person can plan to come with you! $10 per registrant and support person Childbirth Class- online option: This online class offers you the freedom to complete a Birth and Baby series in the comfort of your own home. The flexibility of this option allows you to review sections at your own pace, at times convenient to you and your support people. It includes additional video information, animations, quizzes, and extended activities. Get organized with helpful eClass tools, checklists, and trackers. Once you register online for the class, you will receive an email within a few days to accept the invitation and begin the class when the time is right for you. The content will be available to you for 60 days. $60 for 60 days of online access for you and your support  people.  Local Doulas: Natural Baby Doulas [email or web address] Tel: [phone] [email or web address] [place]oulas 336-448-4114 [email or web address] The Labor Ladies  (also do waterbirth tub rental) 336-515-0240 [email or web address] Tri[facility] Birth Doula 336-312-4678 [email or web address] [email or web address] Sacred Rhythms  336-239-2124 [email or web address]/ [place] [employer]oul[employer] Association (PADA) [email or web address] http://www.padanc.org/index.htm [place] Birth and Baby  [email or web address]/ Considering Waterbirth? Guide for patients at [facility]  Why consider waterbirth?  . Gentle birth for babies . Less pain medicine used in labor . May allow for passive descent/less pushing . May reduce perineal tears  . More mobility and instinctive maternal position changes . Increased maternal relaxation . Reduced blood pressure in labor  Is waterbirth safe? What are the risks of infection, drowning or other complications?  . Infection: o Very low risk (3.7 % for tub vs 4.8% for bed) o 7 in 8000 waterbirths with documented infection o Poorly cleaned equipment most common cause o Slightly lower group B strep transmission rate  . Drowning o Maternal:  - Very low risk   - Related to seizures or fainting o Newborn:  - Very low risk. No evidence of increased risk of respiratory problems in multiple large studies - Physiological protection from breathing under water - Avoid underwater birth if there are any fetal complications - Once baby's head is out of the water, keep it out.  . Birth complication o Some reports of cord trauma, but risk decreased by   baby to surface gradually o No evidence of increased risk of shoulder dystocia. Mothers can usually change positions faster in water than in a bed, possibly aiding the  maneuvers to free the shoulder.   You must attend a Doren Custard class at [facility]  3rd Wednesday of every month from 7-9pm  [employer] by calling [phone] or online at [email or web address]  Bring [place] the certificate from the class to your prenatal appointment  Meet with a midwife at 36 weeks to see if you can still plan a waterbirth and to sign the consent.   Purchase or rent the following supplies:   Water Birth Pool (Birth Pool in a Box or [place] for instance)  (Tubs start ~$125)  Single-use disposable tub liner designed for your brand of tub  New garden hose labeled "lead-free", "suitable for drinking water",  Electric drain pump to remove water (We recommend 792 gallon per hour or greater pump.)   Separate garden hose to remove the dirty water  Fish net  Bathing suit top (optional)  Long-handled mirror (optional)  Places to purchase or rent supplies  GotWebTools.is for tub purchases and supplies  Waterbirthsolutions.com for tub purchases and supplies  The Labor Ladies (www.thelaborladies.com) $275 for tub rental/set-up & take down/kit   [employer] (http://www.fleming.com/.htm) Information regarding doulas (labor support) who provide pool rentals  Our practice has a Birth Pool in a Box tub at the hospital that you may borrow on a first-come-first-served basis. It is your responsibility to to set up, clean and break down the tub. We cannot guarantee the availability of this tub in advance. You are responsible for bringing all accessories listed above. If you do not have all necessary supplies you cannot have a waterbirth.    Things that would prevent you from having a waterbirth:  Premature, <37wks  Previous cesarean birth  Presence of thick meconium-stained fluid  Multiple gestation (Twins, triplets, etc.)  Uncontrolled diabetes or gestational diabetes requiring medication  Hypertension requiring medication  or diagnosis of pre-eclampsia  Heavy vaginal bleeding  Non-reassuring fetal heart rate  Active infection (MRSA, etc.). Group B Strep is NOT a contraindication for  waterbirth.  If your labor has to be induced and induction method requires continuous  monitoring of the baby's heart rate  Other risks/issues identified by your obstetrical provider  Please remember that birth is unpredictable. Under certain unforeseeable circumstances your provider may advise against giving birth in the tub. These decisions will be made on a case-by-case basis and with the safety of you and your baby as our highest priority.  AREA PEDIATRIC/FAMILY [place] [address] [place], [place]  [address] Phone - [phone]   Fax - [phone]  [facility] [address] [place], [place] [address] Phone - [phone]   Fax - [place] [address] [place], [place]  [address] Phone - [phone]   Fax - [phone]  [place], [place] [address] [place], [place]  [address] Phone - [phone]   Fax - [phone]  [place] [address] [place], [place]  [address] Phone - [phone]   Fax - [phone]  [facility] [address] [place], [place]  [address] Phone - [phone]   Fax - [place] [address] [place], [place]  [address] Phone - [phone]   Fax [phone]  [facility] FAMILY MEDICINE AT [facility] [address] [place], [place], [place]  [address] Phone - [phone]   Fax [phone]  [facility] FAMILY MEDICINE AT [facility] [address] [place], [place]  [address] Phone - [phone]   Fax - [phone] [facility] [place] [address] [place], [place]  [address] Phone - [phone]   Fax - [phone]  [place] [address] [place], [place]  [address] Phone - [phone]   Fax - [phone]  [facility] FAMILY MEDICINE AT [place], [place], [place]  [address] Phone - [phone]   Fax - [place] [address], [place], [place]  [address] Phone - [phone]   Fax - [phone]  [facility] [address], [place], [place]  [address] Phone - [place], [place]  [address] Phone - [phone]   Fax - [place] [address], [place], [place]  [address] Phone - [phone]   Fax - [phone]  [place] [address] [place], [place]  [address] Phone - [phone]   Fax - [address] [place], [place]  [address] Phone - [phone]   Fax - [place], [place], [place]  [address] Phone - [phone]   Fax - [place] [address], [place], [place]  [address] Phone - [phone]   Fax - [phone]  [facility] [place] [address] [place], [place]  [address] Phone - [phone]   Fax - [place] [address] [place], [place]  [address] Phone - [phone]   Fax - [phone]  [place] [address] [place], [place]  [address] Phone - [phone]   Fax - [phone] [place] [address] [place], [place]  [address] Phone - [phone]   Fax - [place] [address] [place], [place]  [address] Phone - [phone]   Fax - [place] [address] [place], [place]  [address] Phone - [phone]   Fax - [phone]  [place] [address] [place] [address] Phone [phone]  Fax [phone]

## 2018-02-18 NOTE — Progress Notes (Signed)
  Subjective:    Theresa Owens is being seen today for her first obstetrical visit.  This is not a planned pregnancy. She is at [redacted]w[redacted]d gestation. Her obstetrical history is significant for none . Relationship with FOB: married, but separated. husband is not FOB. Patient does not intend to breast feed. Pregnancy history fully reviewed.  Patient with history of thyroid cancer with thyroidectomy in 2006, and is now hypothyroid. Currently on 6mcg/day. Follows with [facility].   Patient reports no complaints.  Review of Systems:   Review of Systems  All other systems reviewed and are negative.   Objective:     BP 113/65   Pulse 80   Wt 120 lb 6.4 oz (54.6 kg)   LMP 11/29/2017 (Exact Date)   BMI 23.51 kg/m  Physical Exam  Nursing note and vitals reviewed. Constitutional: She is oriented to person, place, and time. She appears well-developed and well-nourished. No distress.  HENT:  Head: Normocephalic.  Cardiovascular: Normal rate.  Respiratory: Effort normal. Right breast exhibits no inverted nipple, no mass and no nipple discharge. Left breast exhibits no inverted nipple, no mass and no nipple discharge.  GI: Soft. There is no tenderness. There is no rebound.  Neurological: She is alert and oriented to person, place, and time.  Skin: Skin is warm and dry.  Psychiatric: She has a normal mood and affect.    Maternal Exam:  Abdomen: Fundal height is 11.       Fetal Exam Fetal Monitor Review: Mode: hand-held doppler probe.   Baseline rate: 154.         Assessment:    Pregnancy: [id number] Patient Active Problem List   Diagnosis Date Noted  . Supervision of other normal pregnancy, antepartum 02/18/2018  . History of thyroid cancer 02/18/2018  . Hypothyroidism 04/29/2015       Plan:     Initial labs drawn. Prenatal vitamins. Problem list reviewed and updated. Panorama today  AFP discussed: requested. Role of ultrasound in pregnancy discussed; fetal survey:  requested. Amniocentesis discussed: not indicated. Follow up in 4 weeks. 50% of 45 min visit spent on counseling and coordination of care.     Marcille Buffy 02/18/2018

## 2018-02-19 ENCOUNTER — Encounter: Payer: Self-pay | Admitting: *Deleted

## 2018-02-19 LAB — GC/CHLAMYDIA PROBE AMP (~~LOC~~) NOT AT ARMC
Chlamydia: NEGATIVE
Neisseria Gonorrhea: NEGATIVE

## 2018-02-20 LAB — CULTURE, OB URINE

## 2018-02-20 LAB — URINE CULTURE, OB REFLEX

## 2018-02-24 ENCOUNTER — Encounter: Payer: Self-pay | Admitting: Advanced Practice Midwife

## 2018-02-26 LAB — SMN1 COPY NUMBER ANALYSIS (SMA CARRIER SCREENING)

## 2018-02-26 LAB — OBSTETRIC PANEL, INCLUDING HIV
Antibody Screen: NEGATIVE
BASOS ABS: 0 10*3/uL (ref 0.0–0.2)
Basos: 0 %
EOS (ABSOLUTE): 0.2 10*3/uL (ref 0.0–0.4)
EOS: 2 %
HEMATOCRIT: 35.8 % (ref 34.0–46.6)
HEMOGLOBIN: 12 g/dL (ref 11.1–15.9)
HEP B S AG: NEGATIVE
HIV SCREEN 4TH GENERATION: NONREACTIVE
Immature Grans (Abs): 0 10*3/uL (ref 0.0–0.1)
Immature Granulocytes: 0 %
LYMPHS ABS: 1.8 10*3/uL (ref 0.7–3.1)
Lymphs: 23 %
MCH: 27.7 pg (ref 26.6–33.0)
MCHC: 33.5 g/dL (ref 31.5–35.7)
MCV: 83 fL (ref 79–97)
MONOCYTES: 5 %
Monocytes Absolute: 0.4 10*3/uL (ref 0.1–0.9)
NEUTROS ABS: 5.4 10*3/uL (ref 1.4–7.0)
Neutrophils: 70 %
Platelets: 194 10*3/uL (ref 150–379)
RBC: 4.33 x10E6/uL (ref 3.77–5.28)
RDW: 12.9 % (ref 12.3–15.4)
RH TYPE: POSITIVE
RPR: NONREACTIVE
RUBELLA: 2.59 {index} (ref >0.99)
WBC: 7.8 10*3/uL (ref 3.4–10.8)

## 2018-02-26 LAB — CYSTIC FIBROSIS GENE TEST

## 2018-02-26 LAB — TSH: TSH: 15.09 u[IU]/mL — AB (ref 0.450–4.500)

## 2018-02-26 LAB — HEMOGLOBIN A1C
Est. average glucose Bld gHb Est-mCnc: 94 mg/dL
HEMOGLOBIN A1C: 4.9 % (ref 4.8–5.6)

## 2018-02-26 LAB — T4, FREE: Free T4: 1 ng/dL (ref 0.82–1.77)

## 2018-02-26 MED ORDER — LEVOTHYROXINE SODIUM 150 MCG PO TABS: 150.0000 ug | ORAL_TABLET | Freq: Every day | ORAL | 3 refills | Status: DC

## 2018-02-26 NOTE — Addendum Note (Signed)
Addended by: Marcille Buffy D on: 02/26/2018 05:18 PM   Modules accepted: Orders

## 2018-03-04 ENCOUNTER — Telehealth: Payer: Self-pay

## 2018-03-04 NOTE — Telephone Encounter (Signed)
-----   Message from Tresea Mall, CNM sent at 02/26/2018  5:17 PM EDT ----- Consult with Dr. Nehemiah Settle, ok to increase synthroid to 190mcg and recheck in 4-6 weeks.  Patient needs to increase her dose of synthroid to 18mcg/day. Please call her. I have sent in a rx to her pharmacy.

## 2018-03-04 NOTE — Telephone Encounter (Signed)
Called patient inform her of the change in lab and increase in dosages for synthroid to 111mcg. Patient agreeable with plan.

## 2018-03-06 ENCOUNTER — Encounter: Payer: Self-pay | Admitting: *Deleted

## 2018-03-18 ENCOUNTER — Encounter: Payer: Self-pay | Admitting: Advanced Practice Midwife

## 2018-03-18 ENCOUNTER — Ambulatory Visit (INDEPENDENT_AMBULATORY_CARE_PROVIDER_SITE_OTHER): Payer: Medicaid Other | Admitting: Advanced Practice Midwife

## 2018-03-18 VITALS — BP 109/59 | HR 77 | Wt 121.6 lb

## 2018-03-18 DIAGNOSIS — E890 Postprocedural hypothyroidism: Secondary | ICD-10-CM

## 2018-03-18 DIAGNOSIS — Z3480 Encounter for supervision of other normal pregnancy, unspecified trimester: Secondary | ICD-10-CM

## 2018-03-18 NOTE — Progress Notes (Signed)
   PRENATAL VISIT NOTE  Subjective:  Theresa Owens is a 37 y.o. [id number] at [redacted]w[redacted]d being seen today for ongoing prenatal care.  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  Patient reports no complaints.  Contractions: Not present. Vag. Bleeding: Scant.  Movement: Absent. Denies leaking of fluid.   The following portions of the patient's history were reviewed and updated as appropriate: allergies, current medications, past family history, past medical history, past social history, past surgical history and problem list. Problem list updated.  Objective:   Vitals:   03/18/18 1111  BP: (!) 109/59  Pulse: 77  Weight: 121 lb 9.6 oz (55.2 kg)    Fetal Status: Fetal Heart Rate (bpm): 140   Movement: Absent     General:  Alert, oriented and cooperative. Patient is in no acute distress.  Skin: Skin is warm and dry. No rash noted.   Cardiovascular: Normal heart rate noted  Respiratory: Normal respiratory effort, no problems with respiration noted  Abdomen: Soft, gravid, appropriate for gestational age.  Pain/Pressure: Present     Pelvic: Cervical exam deferred        Extremities: Normal range of motion.  Edema: None  Mental Status: Normal mood and affect. Normal behavior. Normal judgment and thought content.   Assessment and Plan:  Pregnancy: [id number] at [redacted]w[redacted]d  1. Supervision of other normal pregnancy, antepartum  - AFP, Serum, Open Spina Bifida  Preterm labor symptoms and general obstetric precautions including but not limited to vaginal bleeding, contractions, leaking of fluid and fetal movement were reviewed in detail with the patient. Please refer to After Visit Summary for other counseling recommendations.  Return in about 1 month (around 04/15/2018).  Future  Appointments  Date Time Provider [place]  04/15/2018 10:15 AM [place] 4 WH-MFCUS MFC-US  04/15/2018  3:15 PM Tresea Mall, CNM WOC-WOCA WOC    Marcille Buffy, CNM

## 2018-03-18 NOTE — Patient Instructions (Signed)

## 2018-03-20 LAB — AFP, SERUM, OPEN SPINA BIFIDA
AFP MOM: 1.35
AFP VALUE AFPOSL: 40 ng/mL
GEST. AGE ON COLLECTION DATE: 15 wk
Maternal Age At EDD: 34.8 yr
OSBR Risk 1 IN: 4151
Test Results:: NEGATIVE
WEIGHT: 122 [lb_av]

## 2018-04-15 ENCOUNTER — Other Ambulatory Visit: Payer: Self-pay | Admitting: Advanced Practice Midwife

## 2018-04-15 ENCOUNTER — Ambulatory Visit (HOSPITAL_COMMUNITY)
Admission: RE | Admit: 2018-04-15 | Discharge: 2018-04-15 | Disposition: A | Discharge status: Home / Self Care | Payer: Medicaid Other | Source: Ambulatory Visit | Attending: Advanced Practice Midwife | Admitting: Advanced Practice Midwife

## 2018-04-15 ENCOUNTER — Encounter: Payer: Medicaid Other | Admitting: Advanced Practice Midwife

## 2018-04-15 DIAGNOSIS — O99282 Endocrine, nutritional and metabolic diseases complicating pregnancy, second trimester: Secondary | ICD-10-CM | POA: Diagnosis not present

## 2018-04-15 DIAGNOSIS — Z85850 Personal history of malignant neoplasm of thyroid: Secondary | ICD-10-CM | POA: Diagnosis not present

## 2018-04-15 DIAGNOSIS — O99280 Endocrine, nutritional and metabolic diseases complicating pregnancy, unspecified trimester: Secondary | ICD-10-CM

## 2018-04-15 DIAGNOSIS — O2690 Pregnancy related conditions, unspecified, unspecified trimester: Secondary | ICD-10-CM

## 2018-04-15 DIAGNOSIS — E890 Postprocedural hypothyroidism: Secondary | ICD-10-CM | POA: Diagnosis not present

## 2018-04-15 DIAGNOSIS — Z363 Encounter for antenatal screening for malformations: Secondary | ICD-10-CM

## 2018-04-15 DIAGNOSIS — O09299 Supervision of pregnancy with other poor reproductive or obstetric history, unspecified trimester: Secondary | ICD-10-CM

## 2018-04-15 DIAGNOSIS — Z8632 Personal history of gestational diabetes: Secondary | ICD-10-CM | POA: Insufficient documentation

## 2018-04-15 DIAGNOSIS — Z3A22 22 weeks gestation of pregnancy: Secondary | ICD-10-CM

## 2018-04-15 DIAGNOSIS — Z3480 Encounter for supervision of other normal pregnancy, unspecified trimester: Secondary | ICD-10-CM

## 2018-04-15 DIAGNOSIS — E039 Hypothyroidism, unspecified: Secondary | ICD-10-CM

## 2018-04-15 DIAGNOSIS — Z3687 Encounter for antenatal screening for uncertain dates: Secondary | ICD-10-CM | POA: Diagnosis not present

## 2018-04-15 DIAGNOSIS — Z3492 Encounter for supervision of normal pregnancy, unspecified, second trimester: Secondary | ICD-10-CM

## 2018-04-15 DIAGNOSIS — O09292 Supervision of pregnancy with other poor reproductive or obstetric history, second trimester: Secondary | ICD-10-CM | POA: Diagnosis not present

## 2018-06-10 ENCOUNTER — Ambulatory Visit (INDEPENDENT_AMBULATORY_CARE_PROVIDER_SITE_OTHER): Payer: Medicaid Other | Admitting: Advanced Practice Midwife

## 2018-06-10 VITALS — BP 107/65 | HR 79 | Wt 128.6 lb

## 2018-06-10 DIAGNOSIS — Z23 Encounter for immunization: Secondary | ICD-10-CM | POA: Diagnosis not present

## 2018-06-10 DIAGNOSIS — E890 Postprocedural hypothyroidism: Secondary | ICD-10-CM

## 2018-06-10 DIAGNOSIS — Z3483 Encounter for supervision of other normal pregnancy, third trimester: Secondary | ICD-10-CM

## 2018-06-10 DIAGNOSIS — Z3480 Encounter for supervision of other normal pregnancy, unspecified trimester: Secondary | ICD-10-CM | POA: Diagnosis not present

## 2018-06-10 NOTE — Progress Notes (Signed)
107/65 

## 2018-06-10 NOTE — Progress Notes (Signed)
   PRENATAL VISIT NOTE  Subjective:  Theresa Owens is a 37 y.o. [phone] at [redacted]w[redacted]d being seen today for ongoing prenatal care.  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  Patient reports no complaints.  Contractions: Irritability. Vag. Bleeding: None.  Movement: Present. Denies leaking of fluid.   Patient reports that she has been taking 162mcg of synthroid as she was called about this a while back.   Lapse in care for approx 15 weeks.   The following portions of the patient's history were reviewed and updated as appropriate: allergies, current medications, past family history, past medical history, past social history, past surgical history and problem list. Problem list updated.  Objective:   Vitals:   06/10/18 1441  BP: 107/65  Pulse: 79  Weight: 128 lb 9.6 oz (58.3 kg)    Fetal Status: Fetal Heart Rate (bpm): 147 Fundal Height: 30 cm Movement: Present     General:  Alert, oriented and cooperative. Patient is in no acute distress.  Skin: Skin is warm and dry. No rash noted.   Cardiovascular: Normal heart rate noted  Respiratory: Normal respiratory effort, no problems with respiration noted  Abdomen: Soft, gravid, appropriate for gestational age.  Pain/Pressure: Present     Pelvic: Cervical exam deferred        Extremities: Normal range of motion.  Edema: Trace  Mental Status: Normal mood and affect. Normal behavior. Normal judgment and thought content.   Assessment and Plan:  Pregnancy: [id number] at [redacted]w[redacted]d  1. Supervision of other normal pregnancy, antepartum - CBC; Future - HIV antibody (with reflex); Future - RPR; Future - Glucose Tolerance, 1 Hour; Future - 1 hour GTT today due to lapse in care and uncertainty of patient returning for fasting 2 hour GTT    2. Postoperative hypothyroidism - [place] MFM OB FOLLOW UP; Future - TSH; Future  Preterm labor symptoms and general obstetric precautions including but not limited to vaginal bleeding, contractions, leaking of fluid and fetal movement were reviewed in detail with the patient. Please refer to After Visit Summary for other counseling recommendations.  Return in about 2 weeks (around 06/24/2018).  Future Appointments  Date Time Provider [place]  06/24/2018  8:30 AM [facility] [place]    Marcille Buffy, CNM

## 2018-06-10 NOTE — Addendum Note (Signed)
Addended by: Diona Foley on: 06/10/2018 03:51 PM   Modules accepted: Orders

## 2018-06-10 NOTE — Patient Instructions (Signed)

## 2018-06-11 LAB — CBC
Hematocrit: 37.8 % (ref 34.0–46.6)
Hemoglobin: 12.3 g/dL (ref 11.1–15.9)
MCH: 25.1 pg — AB (ref 26.6–33.0)
MCHC: 32.5 g/dL (ref 31.5–35.7)
MCV: 77 fL — ABNORMAL LOW (ref 79–97)
Platelets: 195 10*3/uL (ref 150–450)
RBC: 4.9 x10E6/uL (ref 3.77–5.28)
RDW: 14.7 % (ref 12.3–15.4)
WBC: 9.2 10*3/uL (ref 3.4–10.8)

## 2018-06-11 LAB — TSH: TSH: 0.007 u[IU]/mL — AB (ref 0.450–4.500)

## 2018-06-11 LAB — GLUCOSE TOLERANCE, 1 HOUR: Glucose, 1Hr PP: 108 mg/dL (ref 65–199)

## 2018-06-11 LAB — HIV ANTIBODY (ROUTINE TESTING W REFLEX): HIV SCREEN 4TH GENERATION: NONREACTIVE

## 2018-06-11 LAB — RPR: RPR: NONREACTIVE

## 2018-06-18 ENCOUNTER — Ambulatory Visit (HOSPITAL_COMMUNITY): Payer: Medicaid Other

## 2018-06-24 ENCOUNTER — Ambulatory Visit (HOSPITAL_COMMUNITY)
Admission: RE | Admit: 2018-06-24 | Discharge: 2018-06-24 | Disposition: A | Discharge status: Home / Self Care | Payer: Medicaid Other | Source: Ambulatory Visit | Attending: Advanced Practice Midwife | Admitting: Advanced Practice Midwife

## 2018-06-24 ENCOUNTER — Other Ambulatory Visit: Payer: Self-pay | Admitting: Advanced Practice Midwife

## 2018-06-24 ENCOUNTER — Other Ambulatory Visit: Payer: Self-pay | Admitting: General Practice

## 2018-06-24 ENCOUNTER — Encounter: Payer: Self-pay | Admitting: General Practice

## 2018-06-24 DIAGNOSIS — E039 Hypothyroidism, unspecified: Secondary | ICD-10-CM

## 2018-06-24 DIAGNOSIS — O99280 Endocrine, nutritional and metabolic diseases complicating pregnancy, unspecified trimester: Principal | ICD-10-CM

## 2018-06-24 DIAGNOSIS — O09293 Supervision of pregnancy with other poor reproductive or obstetric history, third trimester: Secondary | ICD-10-CM | POA: Diagnosis not present

## 2018-06-24 DIAGNOSIS — E890 Postprocedural hypothyroidism: Secondary | ICD-10-CM | POA: Diagnosis not present

## 2018-06-24 DIAGNOSIS — Z85850 Personal history of malignant neoplasm of thyroid: Secondary | ICD-10-CM | POA: Diagnosis not present

## 2018-06-24 DIAGNOSIS — Z3A32 32 weeks gestation of pregnancy: Secondary | ICD-10-CM | POA: Insufficient documentation

## 2018-06-24 DIAGNOSIS — Z8632 Personal history of gestational diabetes: Secondary | ICD-10-CM | POA: Diagnosis not present

## 2018-06-24 DIAGNOSIS — O99283 Endocrine, nutritional and metabolic diseases complicating pregnancy, third trimester: Secondary | ICD-10-CM | POA: Diagnosis not present

## 2018-06-24 DIAGNOSIS — Z79899 Other long term (current) drug therapy: Secondary | ICD-10-CM | POA: Diagnosis not present

## 2018-06-24 MED ORDER — LEVOTHYROXINE SODIUM 125 MCG PO TABS: 125.0000 ug | ORAL_TABLET | Freq: Every day | ORAL | 0 refills | Status: DC

## 2018-06-24 NOTE — Progress Notes (Unsigned)
Patient came by office requesting refill on synthroid. Per Marcille Buffy, change Rx to 159mcg and recheck thyroid labs in 6 weeks. Discussed with patient & also recommended mychart signup. Patient verbalized understanding.

## 2018-06-26 ENCOUNTER — Other Ambulatory Visit (HOSPITAL_COMMUNITY)
Admission: RE | Admit: 2018-06-26 | Discharge: 2018-06-26 | Disposition: A | Discharge status: Home / Self Care | Payer: Medicaid Other | Source: Ambulatory Visit | Attending: Nurse Practitioner | Admitting: Nurse Practitioner

## 2018-06-26 ENCOUNTER — Ambulatory Visit (INDEPENDENT_AMBULATORY_CARE_PROVIDER_SITE_OTHER): Payer: Medicaid Other | Admitting: Nurse Practitioner

## 2018-06-26 VITALS — BP 102/63 | HR 75 | Wt 132.0 lb

## 2018-06-26 DIAGNOSIS — O4693 Antepartum hemorrhage, unspecified, third trimester: Secondary | ICD-10-CM | POA: Insufficient documentation

## 2018-06-26 DIAGNOSIS — Z3480 Encounter for supervision of other normal pregnancy, unspecified trimester: Secondary | ICD-10-CM

## 2018-06-26 DIAGNOSIS — Z3A32 32 weeks gestation of pregnancy: Secondary | ICD-10-CM | POA: Insufficient documentation

## 2018-06-26 DIAGNOSIS — E890 Postprocedural hypothyroidism: Secondary | ICD-10-CM

## 2018-06-26 NOTE — Patient Instructions (Signed)
What You Need to Know About Female Sterilization Female sterilization is surgery to prevent pregnancy. In this surgery, the fallopian tubes are either blocked or closed off. This prevents eggs from reaching the uterus so that the eggs cannot be fertilized by sperm and you cannot get pregnant. Sterilization is permanent. It should only be done if you are sure that you do not want to be able to have children. What are the sterilization surgery options? There are several kinds of female sterilization surgeries. They include:  Laparoscopic tubal ligation. In this surgery, the fallopian tubes are tied off, sealed with heat, or blocked with a clip, ring, or clamp. A small portion of each fallopian tube may also be removed. This surgery is done through several small cuts (incisions).  Postpartum tubal ligation. This is also called a mini-laparotomy. This surgery is done right after childbirth or 1 or 2 days after childbirth. In this surgery, the fallopian tubes are tied off, sealed with heat, or blocked with a clip, ring, or clamp. A small portion of each fallopian tube may also be removed. The surgery is done through a single incision.  Hysteroscopic sterilization. In this surgery, a tiny, spring-like coil is inserted through the cervix and uterus into the fallopian tubes. The coil causes scarring, which blocks the tubes. After the surgery, contraception should be used for 3 months to allow the scar tissue to form completely.  Is sterilization safe? Generally, sterilization is safe. Complications are rare. However, there are risks. They include:  Bleeding.  Infection.  Reaction to medicine used during the procedure.  Injury to surrounding organs.  Failure of the procedure.  How effective is sterilization? Sterilization is nearly 100% effective, but it can fail. Also, the fallopian tubes can grow back together over time. If this happens, you will be able to get pregnant again. Women who have had  this procedure have a higher chance of having an ectopic pregnancy. An ectopic pregnancy is a pregnancy that happens outside of the uterus. This kind of pregnancy is unsuccessful and can lead to serious bleeding if it is not treated. What are the benefits?  It is usually effective for a lifetime.  It is usually safe.  It does not have the drawbacks of other types of birth control: That means: ? Your hormones are not affected. Because of this, your menstrual periods, sexual desire, and sexual performance will not be affected. ? There are no side effects. What are the drawbacks?  If you change your mind and decide that you want to have children, you may not be able to. Sterilization may be reversed, but a reversal is not always successful.  It does not provide protection against STDs (sexually transmitted diseases).  It increases the chance of having an ectopic pregnancy. This information is not intended to replace advice given to you by your health care provider. Make sure you discuss any questions you have with your health care provider. Document Released: 05/08/2008 Document Revised: 07/13/2016 Document Reviewed: 08/17/2015 Elsevier Interactive Patient Education  2018 Elsevier Inc[employer]  

## 2018-06-26 NOTE — Progress Notes (Signed)
Patient reports spotting for the past three days, denies recent intercourse

## 2018-06-26 NOTE — Progress Notes (Signed)
     Subjective:  Theresa Owens is a 36 y.o. [phone] at [redacted]w[redacted]d being seen today for ongoing prenatal care.  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  Patient reports bleeding.  Bleeding has been red when wiping for 3 days.  Contractions: Not present. Vag. Bleeding: Scant.  Movement: Present. Denies leaking of fluid.   The following portions of the patient's history were reviewed and updated as appropriate: allergies, current medications, past family history, past medical history, past social history, past surgical history and problem list. Problem list updated.  Objective:   Vitals:   06/26/18 1122  BP: 102/63  Pulse: 75  Weight: 132 lb (59.9 kg)    Fetal Status: Fetal Heart Rate (bpm): 130 Fundal Height: 32 cm Movement: Present     General:  Alert, oriented and cooperative. Patient is in no acute distress.  Skin: Skin is warm and dry. No rash noted.   Cardiovascular: Normal heart rate noted  Respiratory: Normal respiratory effort, no problems with respiration noted  Abdomen: Soft, gravid, appropriate for gestational age. Pain/Pressure: Present     Pelvic:  Cervical exam performed      Speculum exam - small amount of dark pink, cloudy discharge seen, Cervix appears lobular and larger at 10 o'clock, friable with speculum and with swab, cultures done, cervix closed and thick  Extremities: Normal range of motion.  Edema: None  Mental Status: Normal mood and affect. Normal behavior. Normal judgment and thought content.   Urinalysis:      Assessment and Plan:  Pregnancy: [id number] at [redacted]w[redacted]d  1. Hypothyroidism, postsurgical  taking medication  2. Supervision of other normal pregnancy, antepartum Doing well  3. Vaginal bleeding in pregnancy, third trimester No placenta  previa on [place]. Cervical bleeding seen - friable would recommend pap postpartum based on appearance of cervix today  - Cervicovaginal ancillary only  Preterm labor symptoms and general obstetric precautions including but not limited to vaginal bleeding, contractions, leaking of fluid and fetal movement were reviewed in detail with the patient. Please refer to After Visit Summary for other counseling recommendations.  Return in about 2 weeks (around 07/10/2018).  Earlie Server, RN, MSN, NP-BC Nurse Practitioner, [facility] for [employer], [place] [facility] 06/26/2018 12:20 PM

## 2018-06-28 LAB — CERVICOVAGINAL ANCILLARY ONLY
CHLAMYDIA, DNA PROBE: NEGATIVE
Neisseria Gonorrhea: NEGATIVE

## 2018-07-08 ENCOUNTER — Ambulatory Visit (INDEPENDENT_AMBULATORY_CARE_PROVIDER_SITE_OTHER): Payer: Medicaid Other | Admitting: Advanced Practice Midwife

## 2018-07-08 VITALS — BP 110/67 | HR 71 | Wt 134.8 lb

## 2018-07-08 DIAGNOSIS — Z3483 Encounter for supervision of other normal pregnancy, third trimester: Secondary | ICD-10-CM

## 2018-07-08 DIAGNOSIS — E890 Postprocedural hypothyroidism: Secondary | ICD-10-CM

## 2018-07-08 DIAGNOSIS — N888 Other specified noninflammatory disorders of cervix uteri: Secondary | ICD-10-CM

## 2018-07-08 NOTE — Progress Notes (Addendum)
   PRENATAL VISIT NOTE  Subjective:  Theresa Owens is a 37 y.o. [phone] at [redacted]w[redacted]d being seen today for ongoing prenatal care.  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  Patient reports no complaints.  Contractions: Irritability. Vag. Bleeding: Scant.  Movement: Present. Denies leaking of fluid.   The following portions of the patient's history were reviewed and updated as appropriate: allergies, current medications, past family history, past medical history, past social history, past surgical history and problem list. Problem list updated.  Objective:   Vitals:   07/08/18 0922  BP: 110/67  Pulse: 71  Weight: 134 lb 12.8 oz (61.1 kg)    Fetal Status: Fetal Heart Rate (bpm): 140   Movement: Present   FH: 34cm  General:  Alert, oriented and cooperative. Patient is in no acute distress.  Skin: Skin is warm and dry. No rash noted.   Cardiovascular: Normal heart rate noted  Respiratory: Normal respiratory effort, no problems with respiration noted  Abdomen: Soft, gravid, appropriate for gestational age.  Pain/Pressure: Present     Pelvic: Cervical exam deferred        Extremities: Normal range of motion.  Edema: Trace  Mental Status: Normal mood and affect. Normal behavior. Normal judgment and thought content.   Assessment and Plan:  Pregnancy: [id number] at [redacted]w[redacted]d  1. Encounter for supervision of other normal pregnancy in third trimester --Feeling well today, no complaints, continue routine care --PHQ-9 of 3, no feelings or depression or self-harm --Does not desire BTL, plan for PP Nexplanon  2. Hypothyroidism, postsurgical --TSH next visit on or about 07/22/18  3. Friable cervix --PP Pap  Preterm labor symptoms and general obstetric precautions including but not  limited to vaginal bleeding, contractions, leaking of fluid and fetal movement were reviewed in detail with the patient. Please refer to After Visit Summary for other counseling recommendations.  Return in about 2 weeks (around 07/22/2018).  Future Appointments  Date Time Provider [place]  07/22/2018 10:15 AM Tresea Mall, CNM [facility] [place]  07/29/2018  9:15 AM Tresea Mall, CNM [facility] [place], [place]  07/08/18  9:36 AM

## 2018-07-08 NOTE — Patient Instructions (Signed)

## 2018-07-22 ENCOUNTER — Encounter: Payer: Self-pay | Admitting: Advanced Practice Midwife

## 2018-07-22 ENCOUNTER — Other Ambulatory Visit (HOSPITAL_COMMUNITY)
Admission: RE | Admit: 2018-07-22 | Discharge: 2018-07-22 | Disposition: A | Discharge status: Home / Self Care | Payer: Medicaid Other | Source: Ambulatory Visit | Attending: Advanced Practice Midwife | Admitting: Advanced Practice Midwife

## 2018-07-22 ENCOUNTER — Ambulatory Visit (INDEPENDENT_AMBULATORY_CARE_PROVIDER_SITE_OTHER): Payer: Medicaid Other | Admitting: Advanced Practice Midwife

## 2018-07-22 DIAGNOSIS — Z3483 Encounter for supervision of other normal pregnancy, third trimester: Secondary | ICD-10-CM | POA: Diagnosis not present

## 2018-07-22 DIAGNOSIS — Z3A36 36 weeks gestation of pregnancy: Secondary | ICD-10-CM | POA: Diagnosis not present

## 2018-07-22 DIAGNOSIS — Z3480 Encounter for supervision of other normal pregnancy, unspecified trimester: Secondary | ICD-10-CM | POA: Diagnosis not present

## 2018-07-22 LAB — OB RESULTS CONSOLE GBS: GBS: NEGATIVE

## 2018-07-22 NOTE — Progress Notes (Signed)
   PRENATAL VISIT NOTE  Subjective:  Theresa Owens is a 37 y.o. [phone] at [redacted]w[redacted]d being seen today for ongoing prenatal care.  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  Patient reports no complaints.  Contractions: Irritability. Vag. Bleeding: None.  Movement: Present. Denies leaking of fluid.   The following portions of the patient's history were reviewed and updated as appropriate: allergies, current medications, past family history, past medical history, past social history, past surgical history and problem list. Problem list updated.  Objective:   Vitals:   07/22/18 1007  BP: 110/74  Pulse: 67  Weight: 134 lb (60.8 kg)    Fetal Status: Fetal Heart Rate (bpm): 147 Fundal Height: 36 cm Movement: Present  Presentation: Vertex  General:  Alert, oriented and cooperative. Patient is in no acute distress.  Skin: Skin is warm and dry. No rash noted.   Cardiovascular: Normal heart rate noted  Respiratory: Normal respiratory effort, no problems with respiration noted  Abdomen: Soft, gravid, appropriate for gestational age.  Pain/Pressure: Present     Pelvic: Cervical exam performed Dilation: 1 Effacement (%): 50 Station: -2  Extremities: Normal range of motion.  Edema: Trace  Mental Status: Normal mood and affect. Normal behavior. Normal judgment and thought content.   Assessment and Plan:  Pregnancy: [id number] at [redacted]w[redacted]d  1. Supervision of other normal pregnancy, antepartum - TSH - Culture, beta strep (group b only) - Cervicovaginal ancillary only  Preterm labor symptoms and general obstetric precautions including but not limited to vaginal bleeding, contractions, leaking of fluid and fetal movement were reviewed in detail with the patient. Please refer to After Visit  Summary for other counseling recommendations.  Return in about 1 week (around 07/29/2018).  Future Appointments  Date Time Provider [place]  07/29/2018  9:15 AM Tresea Mall, [place]    Marcille Buffy, CNM

## 2018-07-22 NOTE — Progress Notes (Signed)
   PRENATAL VISIT NOTE  Subjective:  Theresa Owens is a 37 y.o. [phone] at [redacted]w[redacted]d being seen today for ongoing prenatal care.  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  Patient reports no bleeding, no contractions, no cramping and no leaking.  Contractions: Irritability. Vag. Bleeding: None.  Movement: Present. Denies leaking of fluid.   The following portions of the patient's history were reviewed and updated as appropriate: allergies, current medications, past family history, past medical history, past social history, past surgical history and problem list. Problem list updated.  Objective:   Vitals:   07/22/18 1007  BP: 110/74  Pulse: 67  Weight: 134 lb (60.8 kg)    Fetal Status: Fetal Heart Rate (bpm): 147 Fundal Height: 36 cm Movement: Present  Presentation: Vertex  General:  Alert, oriented and cooperative. Patient is in no acute distress.  Skin: Skin is warm and dry. No rash noted.   Cardiovascular: Normal heart rate noted  Respiratory: Normal respiratory effort, no problems with respiration noted  Abdomen: Soft, gravid, appropriate for gestational age.  Pain/Pressure: Present     Pelvic: Cervical exam performed Dilation: 1 Effacement (%): 50 Station: -2  Extremities: Normal range of motion.  Edema: Trace  Mental Status: Normal mood and affect. Normal behavior. Normal judgment and thought content.   Assessment and Plan:  Pregnancy: [id number] at [redacted]w[redacted]d  1. Supervision of other normal pregnancy, antepartum - Culture, beta strep (group b only) - Cervicovaginal ancillary only - f/u in 1 week  2. Hypothyroidism  - TSH  - dose adjustment  Preterm labor symptoms and general obstetric precautions including but not limited to vaginal bleeding, contractions, leaking  of fluid and fetal movement were reviewed in detail with the patient. Please refer to After Visit Summary for other counseling recommendations.  Return in about 1 week (around 07/29/2018).  Future Appointments  Date Time Provider [place]  07/29/2018  9:15 AM Tresea Mall, CNM [facility] [place], [place] 10:30am

## 2018-07-23 LAB — CERVICOVAGINAL ANCILLARY ONLY
Chlamydia: NEGATIVE
Neisseria Gonorrhea: NEGATIVE

## 2018-07-26 ENCOUNTER — Other Ambulatory Visit: Payer: Self-pay | Admitting: Advanced Practice Midwife

## 2018-07-26 LAB — TSH: TSH: 0.014 u[IU]/mL — AB (ref 0.450–4.500)

## 2018-07-26 LAB — CULTURE, BETA STREP (GROUP B ONLY): Strep Gp B Culture: NEGATIVE

## 2018-07-26 MED ORDER — LEVOTHYROXINE SODIUM 100 MCG PO TABS: 100.0000 ug | ORAL_TABLET | Freq: Every day | ORAL | 1 refills | Status: DC

## 2018-07-26 NOTE — Addendum Note (Signed)
Addended by: Marcille Buffy D on: 07/26/2018 10:03 AM   Modules accepted: Orders

## 2018-07-29 ENCOUNTER — Telehealth: Payer: Self-pay | Admitting: *Deleted

## 2018-07-29 ENCOUNTER — Ambulatory Visit (INDEPENDENT_AMBULATORY_CARE_PROVIDER_SITE_OTHER): Payer: Medicaid Other | Admitting: Advanced Practice Midwife

## 2018-07-29 ENCOUNTER — Encounter: Payer: Self-pay | Admitting: Advanced Practice Midwife

## 2018-07-29 VITALS — BP 108/65 | HR 66 | Wt 136.0 lb

## 2018-07-29 DIAGNOSIS — Z3480 Encounter for supervision of other normal pregnancy, unspecified trimester: Secondary | ICD-10-CM

## 2018-07-29 NOTE — Patient Instructions (Signed)
Vaginal delivery means that you will give birth by pushing your baby out of your birth canal (vagina). A team of health care providers will help you before, during, and after vaginal delivery. Birth experiences are unique for every woman and every pregnancy, and birth experiences vary depending on where you choose to give birth. What should I do to prepare for my baby's birth? Before your baby is born, it is important to talk with your health care provider about:  Your labor and delivery preferences. These may include: ? Medicines that you may be given. ? How you will manage your pain. This might include non-medical pain relief techniques or injectable pain relief such as epidural analgesia. ? How you and your baby will be monitored during labor and delivery. ? Who may be in the labor and delivery room with you. ? Your feelings about surgical delivery of your baby (cesarean delivery, or C-section) if this becomes necessary. ? Your feelings about receiving donated blood through an IV tube (blood transfusion) if this becomes necessary.  Whether you are able: ? To take pictures or videos of the birth. ? To eat during labor and delivery. ? To move around, walk, or change positions during labor and delivery.  What to expect after your baby is born, such as: ? Whether delayed umbilical cord clamping and cutting is offered. ? Who will care for your baby right after birth. ? Medicines or tests that may be recommended for your baby. ? Whether breastfeeding is supported in your hospital or birth center. ? How long you will be in the hospital or birth center.  How any medical conditions you have may affect your baby or your labor and delivery experience.  To prepare for your baby's birth, you should also:  Attend all of your health care visits before delivery (prenatal visits) as recommended by your health care provider. This is important.  Prepare your home for your baby's arrival. Make sure  that you have: ? Diapers. ? Baby clothing. ? Feeding equipment. ? Safe sleeping arrangements for you and your baby.  Install a car seat in your vehicle. Have your car seat checked by a certified car seat installer to make sure that it is installed safely.  Think about who will help you with your new baby at home for at least the first several weeks after delivery.  What can I expect when I arrive at the birth center or hospital? Once you are in labor and have been admitted into the hospital or birth center, your health care provider may:  Review your pregnancy history and any concerns you have.  Insert an IV tube into one of your veins. This is used to give you fluids and medicines.  Check your blood pressure, pulse, temperature, and heart rate (vital signs).  Check whether your bag of water (amniotic sac) has broken (ruptured).  Talk with you about your birth plan and discuss pain control options.  Monitoring Your health care provider may monitor your contractions (uterine monitoring) and your baby's heart rate (fetal monitoring). You may need to be monitored:  Often, but not continuously (intermittently).  All the time or for long periods at a time (continuously). Continuous monitoring may be needed if: ? You are taking certain medicines, such as medicine to relieve pain or make your contractions stronger. ? You have pregnancy or labor complications.  Monitoring may be done by:  Placing a special stethoscope or a handheld monitoring device on your abdomen to check your  baby's heartbeat, and feeling your abdomen for contractions. This method of monitoring does not continuously record your baby's heartbeat or your contractions.  Placing monitors on your abdomen (external monitors) to record your baby's heartbeat and the frequency and length of contractions. You may not have to wear external monitors all the time.  Placing monitors inside of your uterus (internal monitors) to  record your baby's heartbeat and the frequency, length, and strength of your contractions. ? Your health care provider may use internal monitors if he or she needs more information about the strength of your contractions or your baby's heart rate. ? Internal monitors are put in place by passing a thin, flexible wire through your vagina and into your uterus. Depending on the type of monitor, it may remain in your uterus or on your baby's head until birth. ? Your health care provider will discuss the benefits and risks of internal monitoring with you and will ask for your permission before inserting the monitors.  Telemetry. This is a type of continuous monitoring that can be done with external or internal monitors. Instead of having to stay in bed, you are able to move around during telemetry. Ask your health care provider if telemetry is an option for you.  Physical exam Your health care provider may perform a physical exam. This may include:  Checking whether your baby is positioned: ? With the head toward your vagina (head-down). This is most common. ? With the head toward the top of your uterus (head-up or breech). If your baby is in a breech position, your health care provider may try to turn your baby to a head-down position so you can deliver vaginally. If it does not seem that your baby can be born vaginally, your provider may recommend surgery to deliver your baby. In rare cases, you may be able to deliver vaginally if your baby is head-up (breech delivery). ? Lying sideways (transverse). Babies that are lying sideways cannot be delivered vaginally.  Checking your cervix to determine: ? Whether it is thinning out (effacing). ? Whether it is opening up (dilating). ? How low your baby has moved into your birth canal.  What are the three stages of labor and delivery?  Normal labor and delivery is divided into the following three stages: Stage 1  Stage 1 is the longest stage of labor,  and it can last for hours or days. Stage 1 includes: ? Early labor. This is when contractions may be irregular, or regular and mild. Generally, early labor contractions are more than 10 minutes apart. ? Active labor. This is when contractions get longer, more regular, more frequent, and more intense. ? The transition phase. This is when contractions happen very close together, are very intense, and may last longer than during any other part of labor.  Contractions generally feel mild, infrequent, and irregular at first. They get stronger, more frequent (about every 2-3 minutes), and more regular as you progress from early labor through active labor and transition.  Many women progress through stage 1 naturally, but you may need help to continue making progress. If this happens, your health care provider may talk with you about: ? Rupturing your amniotic sac if it has not ruptured yet. ? Giving you medicine to help make your contractions stronger and more frequent.  Stage 1 ends when your cervix is completely dilated to 4 inches (10 cm) and completely effaced. This happens at the end of the transition phase. Stage 2  Once your cervix  is completely effaced and dilated to 4 inches (10 cm), you may start to feel an urge to push. It is common for the body to naturally take a rest before feeling the urge to push, especially if you received an epidural or certain other pain medicines. This rest period may last for up to 1-2 hours, depending on your unique labor experience.  During stage 2, contractions are generally less painful, because pushing helps relieve contraction pain. Instead of contraction pain, you may feel stretching and burning pain, especially when the widest part of your baby's head passes through the vaginal opening (crowning).  Your health care provider will closely monitor your pushing progress and your baby's progress through the vagina during stage 2.  Your health care provider may  massage the area of skin between your vaginal opening and anus (perineum) or apply warm compresses to your perineum. This helps it stretch as the baby's head starts to crown, which can help prevent perineal tearing. ? In some cases, an incision may be made in your perineum (episiotomy) to allow the baby to pass through the vaginal opening. An episiotomy helps to make the opening of the vagina larger to allow more room for the baby to fit through.  It is very important to breathe and focus so your health care provider can control the delivery of your baby's head. Your health care provider may have you decrease the intensity of your pushing, to help prevent perineal tearing.  After delivery of your baby's head, the shoulders and the rest of the body generally deliver very quickly and without difficulty.  Once your baby is delivered, the umbilical cord may be cut right away, or this may be delayed for 1-2 minutes, depending on your baby's health. This may vary among health care providers, hospitals, and birth centers.  If you and your baby are healthy enough, your baby may be placed on your chest or abdomen to help maintain the baby's temperature and to help you bond with each other. Some mothers and babies start breastfeeding at this time. Your health care team will dry your baby and help keep your baby warm during this time.  Your baby may need immediate care if he or she: ? Showed signs of distress during labor. ? Has a medical condition. ? Was born too early (prematurely). ? Had a bowel movement before birth (meconium). ? Shows signs of difficulty transitioning from being inside the uterus to being outside of the uterus. If you are planning to breastfeed, your health care team will help you begin a feeding. Stage 3  The third stage of labor starts immediately after the birth of your baby and ends after you deliver the placenta. The placenta is an organ that develops during pregnancy to provide  oxygen and nutrients to your baby in the womb.  Delivering the placenta may require some pushing, and you may have mild contractions. Breastfeeding can stimulate contractions to help you deliver the placenta.  After the placenta is delivered, your uterus should tighten (contract) and become firm. This helps to stop bleeding in your uterus. To help your uterus contract and to control bleeding, your health care provider may: ? Give you medicine by injection, through an IV tube, by mouth, or through your rectum (rectally). ? Massage your abdomen or perform a vaginal exam to remove any blood clots that are left in your uterus. ? Empty your bladder by placing a thin, flexible tube (catheter) into your bladder. ? Encourage you to  breastfeed your baby. After labor is over, you and your baby will be monitored closely to ensure that you are both healthy until you are ready to go home. Your health care team will teach you how to care for yourself and your baby. This information is not intended to replace advice given to you by your health care provider. Make sure you discuss any questions you have with your health care provider. Document Released: 08/29/2008 Document Revised: 06/09/2016 Document Reviewed: 12/05/2015 Elsevier Interactive Patient Education  2018 [employer].

## 2018-07-29 NOTE — Progress Notes (Signed)
   PRENATAL VISIT NOTE  Subjective:  Theresa Owens is a 37 y.o. [phone] at [redacted]w[redacted]d being seen today for ongoing prenatal care.  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  Patient reports no complaints.  Contractions: Irritability. Vag. Bleeding: None.  Movement: Present. Denies leaking of fluid.   The following portions of the patient's history were reviewed and updated as appropriate: allergies, current medications, past family history, past medical history, past social history, past surgical history and problem list. Problem list updated.  Objective:   Vitals:   07/29/18 1005  BP: 108/65  Pulse: 66  Weight: 136 lb (61.7 kg)    Fetal Status: Fetal Heart Rate (bpm): 147 Fundal Height: 37 cm Movement: Present     General:  Alert, oriented and cooperative. Patient is in no acute distress.  Skin: Skin is warm and dry. No rash noted.   Cardiovascular: Normal heart rate noted  Respiratory: Normal respiratory effort, no problems with respiration noted  Abdomen: Soft, gravid, appropriate for gestational age.  Pain/Pressure: Present     Pelvic: Cervical exam deferred        Extremities: Normal range of motion.  Edema: Trace  Mental Status: Normal mood and affect. Normal behavior. Normal judgment and thought content.   Assessment and Plan:  Pregnancy: [id number] at [redacted]w[redacted]d  1. Supervision of other normal pregnancy, antepartum - Routine care - Will start new dose of synthroid today, RX sent to pharmacy   Term labor symptoms and general obstetric precautions including but not limited to vaginal bleeding, contractions, leaking of fluid and fetal movement were reviewed in detail with the patient. Please refer to After Visit Summary for other counseling recommendations.  Return in  about 1 week (around 08/05/2018).  No future appointments.  Marcille Buffy, CNM

## 2018-07-29 NOTE — Telephone Encounter (Signed)
Left voice message for patient to give the clinic and speak with a nurse regarding lab results and medication management.  Derl Barrow, RN

## 2018-07-29 NOTE — Telephone Encounter (Signed)
-----   Message from Tresea Mall, CNM sent at 07/26/2018 10:01 AM EDT ----- Please call patient and let her know that we need to adjust her dose of thyroid medication. I have sent in a new RX, and she should start this new dose.

## 2018-07-30 NOTE — Telephone Encounter (Signed)
Patient picked up Thyroid medication last night.   Derl Barrow, RN

## 2018-08-06 ENCOUNTER — Encounter: Payer: Self-pay | Admitting: Advanced Practice Midwife

## 2018-08-06 ENCOUNTER — Encounter (HOSPITAL_COMMUNITY): Payer: Self-pay

## 2018-08-06 ENCOUNTER — Inpatient Hospital Stay (HOSPITAL_COMMUNITY)
Admission: AD | Admit: 2018-08-06 | Discharge: 2018-08-09 | DRG: 807 | Disposition: A | Discharge status: Home / Self Care | Payer: Medicaid Other | Attending: Obstetrics and Gynecology | Admitting: Obstetrics and Gynecology

## 2018-08-06 ENCOUNTER — Ambulatory Visit (INDEPENDENT_AMBULATORY_CARE_PROVIDER_SITE_OTHER): Payer: Medicaid Other | Admitting: Advanced Practice Midwife

## 2018-08-06 VITALS — BP 144/102 | HR 82 | Wt 139.0 lb

## 2018-08-06 DIAGNOSIS — Z3A38 38 weeks gestation of pregnancy: Secondary | ICD-10-CM | POA: Diagnosis not present

## 2018-08-06 DIAGNOSIS — E039 Hypothyroidism, unspecified: Secondary | ICD-10-CM | POA: Diagnosis present

## 2018-08-06 DIAGNOSIS — E890 Postprocedural hypothyroidism: Secondary | ICD-10-CM | POA: Diagnosis present

## 2018-08-06 DIAGNOSIS — O99284 Endocrine, nutritional and metabolic diseases complicating childbirth: Secondary | ICD-10-CM | POA: Diagnosis present

## 2018-08-06 DIAGNOSIS — Z3483 Encounter for supervision of other normal pregnancy, third trimester: Secondary | ICD-10-CM

## 2018-08-06 DIAGNOSIS — Z85850 Personal history of malignant neoplasm of thyroid: Secondary | ICD-10-CM

## 2018-08-06 DIAGNOSIS — Z3480 Encounter for supervision of other normal pregnancy, unspecified trimester: Secondary | ICD-10-CM

## 2018-08-06 LAB — COMPREHENSIVE METABOLIC PANEL
ALT: 13 U/L (ref 0–44)
AST: 19 U/L (ref 15–41)
Albumin: 3.1 g/dL — ABNORMAL LOW (ref 3.5–5.0)
Alkaline Phosphatase: 189 U/L — ABNORMAL HIGH (ref 38–126)
Anion gap: 9 (ref 5–15)
BUN: 8 mg/dL (ref 6–20)
CALCIUM: 9.2 mg/dL (ref 8.9–10.3)
CHLORIDE: 106 mmol/L (ref 98–111)
CO2: 21 mmol/L — ABNORMAL LOW (ref 22–32)
CREATININE: 0.49 mg/dL (ref 0.44–1.00)
Glucose, Bld: 82 mg/dL (ref 70–99)
Potassium: 4.2 mmol/L (ref 3.5–5.1)
Sodium: 136 mmol/L (ref 135–145)
Total Bilirubin: 0.3 mg/dL (ref 0.3–1.2)
Total Protein: 6.4 g/dL — ABNORMAL LOW (ref 6.5–8.1)

## 2018-08-06 LAB — CBC
HEMATOCRIT: 44.4 % (ref 36.0–46.0)
HEMOGLOBIN: 14.6 g/dL (ref 12.0–15.0)
MCH: 24.6 pg — ABNORMAL LOW (ref 26.0–34.0)
MCHC: 32.9 g/dL (ref 30.0–36.0)
MCV: 74.7 fL — ABNORMAL LOW (ref 78.0–100.0)
Platelets: 152 10*3/uL (ref 150–400)
RBC: 5.94 MIL/uL — AB (ref 3.87–5.11)
RDW: 15.9 % — ABNORMAL HIGH (ref 11.5–15.5)
WBC: 8.6 10*3/uL (ref 4.0–10.5)

## 2018-08-06 LAB — TYPE AND SCREEN
ABO/RH(D): AB POS
Antibody Screen: NEGATIVE

## 2018-08-06 LAB — PROTEIN / CREATININE RATIO, URINE: Creatinine, Urine: 11 mg/dL

## 2018-08-06 MED ORDER — FENTANYL CITRATE (PF) 100 MCG/2ML IJ SOLN
50.0000 ug | INTRAMUSCULAR | Status: DC | PRN
  Administered 2018-08-07: 50 ug via INTRAVENOUS
  Filled 2018-08-06: qty 2

## 2018-08-06 MED ORDER — LEVOTHYROXINE SODIUM 100 MCG PO TABS
100.0000 ug | ORAL_TABLET | Freq: Every day | ORAL | Status: DC
  Filled 2018-08-06: qty 1

## 2018-08-06 MED ORDER — TERBUTALINE SULFATE 1 MG/ML IJ SOLN: 0.2500 mg | Freq: Once | INTRAMUSCULAR | Status: DC | PRN

## 2018-08-06 MED ORDER — OXYTOCIN 40 UNITS IN LACTATED RINGERS INFUSION - SIMPLE MED
2.5000 [IU]/h | INTRAVENOUS | Status: DC
  Filled 2018-08-06: qty 1000

## 2018-08-06 MED ORDER — OXYTOCIN 40 UNITS IN LACTATED RINGERS INFUSION - SIMPLE MED
1.0000 m[IU]/min | INTRAVENOUS | Status: DC
  Administered 2018-08-06: 2 m[IU]/min via INTRAVENOUS

## 2018-08-06 MED ORDER — OXYTOCIN BOLUS FROM INFUSION
500.0000 mL | Freq: Once | INTRAVENOUS | Status: AC
  Administered 2018-08-07: 500 mL via INTRAVENOUS

## 2018-08-06 MED ORDER — LIDOCAINE HCL (PF) 1 % IJ SOLN
30.0000 mL | INTRAMUSCULAR | Status: DC | PRN
  Filled 2018-08-06: qty 30

## 2018-08-06 MED ORDER — LACTATED RINGERS IV SOLN
INTRAVENOUS | Status: DC
  Administered 2018-08-06 (×2): via INTRAVENOUS

## 2018-08-06 MED ORDER — ONDANSETRON HCL 4 MG/2ML IJ SOLN: 4.0000 mg | Freq: Four times a day (QID) | INTRAMUSCULAR | Status: DC | PRN

## 2018-08-06 MED ORDER — SOD CITRATE-CITRIC ACID 500-334 MG/5ML PO SOLN: 30.0000 mL | ORAL | Status: DC | PRN

## 2018-08-06 MED ORDER — ACETAMINOPHEN 325 MG PO TABS
650.0000 mg | ORAL_TABLET | ORAL | Status: DC | PRN
  Administered 2018-08-06: 650 mg via ORAL
  Filled 2018-08-06: qty 2

## 2018-08-06 MED ORDER — OXYCODONE-ACETAMINOPHEN 5-325 MG PO TABS: 2.0000 | ORAL_TABLET | ORAL | Status: DC | PRN

## 2018-08-06 MED ORDER — LACTATED RINGERS IV SOLN: 500.0000 mL | INTRAVENOUS | Status: DC | PRN

## 2018-08-06 MED ORDER — OXYCODONE-ACETAMINOPHEN 5-325 MG PO TABS: 1.0000 | ORAL_TABLET | ORAL | Status: DC | PRN

## 2018-08-06 NOTE — Progress Notes (Signed)
OB[facility]N [facility]: Labor Progress Note  Subjective: Doing well. Feeling discomfort with contractions. Still moving around room. Headache resolved with Tylenol.   Objective: BP 130/75   Pulse (!) 55   Temp 98 F (36.7 C) (Oral)   Resp 16   Ht 5' (1.524 m)   Wt 63 kg   LMP 11/29/2017 (Exact Date)   BMI 27.15 kg/m  Gen: well-appearing, NAD Dilation: 7.5 Effacement (%): 80 Station: -1 Presentation: Vertex Exam by:: Dr. Danley Danker  Assessment and Plan: 37 y.o. [id number] [redacted]w[redacted]d here with SROM this morning.  Labor: Active.  -- pitocin started for augmentation at 1900 -- SROM 1000 -- pain control: does not want epidural  Fetal Well-Being: EFW 6-7lbs by Leopold's. Cephalic by sutures.  -- Category I - continuous fetal monitoring - some periods of minimal variability, trying position changes -- GBS (-)   Theresa Wenig S. Juleen China, DO OB/GYN Fellow, Faculty Practice  10:23 PM

## 2018-08-06 NOTE — H&P (Addendum)
OBSTETRIC ADMISSION HISTORY AND PHYSICAL  Theresa Owens is a 37 y.o. female [phone] with IUP at [redacted]w[redacted]d by LMP presenting for SROM. She reports +FMs, No LOF, no VB, endorses blurry vision and headache since this AM ~1000, mild peripheral edema, no RUQ pain. She received her prenatal [facility].  Dating: By LMP --->  Estimated Date of Delivery: 08/19/18  Sono:    @[redacted]w[redacted]d , CWD, normal anatomy, cephalic presentation, 1448 g, 73% EFW   Prenatal History/Complications:  Past Medical History: Past Medical History:  Diagnosis Date  . History of gestational diabetes    Normal postpartum 2 hr GTT  . Hypothyroidism     Past Surgical History: Past Surgical History:  Procedure Laterality Date  . THYROIDECTOMY N/A    2006    Obstetrical History: OB History    Gravida  5   Para  3   Term  3   Preterm  0   AB  1   Living  3     SAB  1   TAB  0   Ectopic  0   Multiple  0   Live Births  3           Social History: Social History   Socioeconomic History  . Marital status: Legally Separated    Spouse name: Not on file  . Number of children: Not on file  . Years of education: Not on file  . Highest education level: Not on file  Occupational History  . Not on file  Social Needs  . Financial resource strain: Not hard at all  . Food insecurity:    Worry: Not on file    Inability: Not on file  . Transportation needs:    Medical: Not on file    Non-medical: Not on file  Tobacco Use  . Smoking status: Never Smoker  . Smokeless tobacco: Never Used  Substance and Sexual Activity  . Alcohol use: No  . Drug use: No  . Sexual activity: Yes    Birth control/protection: None  Lifestyle  . Physical activity:    Days per week: Not on file    Minutes per session: Not on file  . Stress: Not on file  Relationships  . Social connections:    Talks on phone: Not on file    Gets together: Not on file    Attends religious service: Not on file    Active member of club or  organization: Not on file    Attends meetings of clubs or organizations: Not on file    Relationship status: Not on file  Other Topics Concern  . Not on file  Social History Narrative  . Not on file    Family History: Family History  Problem Relation Age of Onset  . Hypertension Mother   . Hypertension Father     Allergies: No Known Allergies  Medications Prior to Admission  Medication Sig Dispense Refill Last Dose  . levothyroxine (SYNTHROID, LEVOTHROID) 100 MCG tablet TAKE 1 TABLET(100 MCG) BY MOUTH DAILY BEFORE BREAKFAST 90 tablet 1 08/06/2018 at Unknown time  . Prenatal Vit-Fe Fumarate-FA (PRENATAL MULTIVITAMIN) TABS tablet Take 1 tablet by mouth daily at 12 noon.   08/05/2018 at Unknown time     Review of Systems   All systems reviewed and negative except as stated in HPI  Blood pressure 133/82, pulse 78, temperature 98.5 F (36.9 C), temperature source Oral, resp. rate 17, height 5' (1.524 m), weight 63 kg, last menstrual period 11/29/2017.  General appearance: alert, cooperative and no distress Lungs: clear to auscultation bilaterally Heart: regular rate and rhythm, 1+ edema in LE bilaterally Abdomen: soft, non-tender; bowel sounds normal Pelvic: 5/70/-1 Extremities: Homans sign is negative, no sign of DVT DTR's: 2+ in LE bilaterally Presentation: cephalic Fetal monitoringBaseline: 140 bpm, Variability: Good {> 6 bpm), Accelerations: Reactive, Decelerations: Absent and  Uterine activity: irregular contractions Dilation: 5 Effacement (%): 70 Station: -1 Exam by:: Dr. Lia Foyer   Prenatal labs: ABO, Rh: AB/Positive/-- (03/18 1157) Antibody: Negative (03/18 1157) Rubella: 2.59 (03/18 1157) RPR: Non Reactive (07/08 1552)  HBsAg: Negative (03/18 1157)  HIV: Non Reactive (07/08 1552)  GBS: Negative (08/19 0000)  1 hr Glucola: normal per history, could not find in EMR Genetic screening: normal Anatomy US: normal (07/22 0843)  Prenatal Transfer Tool  Maternal  Diabetes: No Genetic Screening: Normal Maternal Ultrasounds/Referrals: Normal Fetal Ultrasounds or other Referrals:  None Maternal Substance Abuse:  No Significant Maternal Medications:  Meds include: Syntroid Significant Maternal Lab Results: Lab values include: Other: AST/ALT normal  Results for orders placed or performed during the hospital encounter of 08/06/18 (from the past 24 hour(s))  CBC   Collection Time: 08/06/18  3:16 PM  Result Value Ref Range   WBC 8.6 4.0 - 10.5 K/uL   RBC 5.94 (H) 3.87 - 5.11 MIL/uL   Hemoglobin 14.6 12.0 - 15.0 g/dL   HCT 44.4 36.0 - 46.0 %   MCV 74.7 (L) 78.0 - 100.0 fL   MCH 24.6 (L) 26.0 - 34.0 pg   MCHC 32.9 30.0 - 36.0 g/dL   RDW 15.9 (H) 11.5 - 15.5 %   Platelets 152 150 - 400 K/uL    Patient Active Problem List   Diagnosis Date Noted  . Normal labor 08/06/2018  . Supervision of other normal pregnancy, antepartum 02/18/2018  . History of thyroid cancer 02/18/2018  . 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 G5P3013 at [redacted]w[redacted]d here for SROM.  #Labor: Expectant management. Start pitocin if progression inadequate.  #Pain: Defers epidural.  #FWB: Cat 1 #ID:  GBS negative #MOF: unknown #MOC: unkown  #Circ:  N/A  Theresa Owens, Student-PA  08/06/2018, 3:50 PM   OB FELLOW MEDICAL STUDENT NOTE ATTESTATION  I confirm that I have verified the information documented in the PA student's note and that I have also personally performed the physical exam and all medical decision making activities.    Theresa Owens is 37 y.o. [phone] female at [redacted]w[redacted]d who presented as direct admit from clinic for SROM (1000, clear fluid). Patient has h/o thyroid cancer and hypothyroidism taking Synthroid daily. H/o GDM in previous pregnancy. One elevated BP noted in clinic, BPs have been within normal range since admission. [place] labs unremarkable. Patient with mild HA at  admission, will give tylenol and re-assess. No history of gHTN or Pre-E with prior pregnancies.   GBS status: negative. FWB Cat I. Ctx q4-5. Minimal cervical change since clinic visit this afternoon. Will augment with Pitocin for more favorable ctx pattern. Patient declines epidural. Patient is having a girl, plans to breast feed, and plans for Nexplanon.   Phill Myron, D.O. OB Fellow  08/06/2018, 6:36 PM

## 2018-08-06 NOTE — Progress Notes (Signed)
   PRENATAL VISIT NOTE  Subjective:  Theresa Owens is a 37 y.o. [phone] at [redacted]w[redacted]d being seen today for ongoing prenatal care.  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  Patient reports no complaints.  Contractions: Irregular. Vag. Bleeding: None.  Movement: Present. Patient reports leaking of fluid since around 1000 this morning. Has had contractions that have been increasing since that time as well. Denies VB. Reports normal fetal movement.   The following portions of the patient's history were reviewed and updated as appropriate: allergies, current medications, past family history, past medical history, past social history, past surgical history and problem list. Problem list updated.  Objective:   Vitals:   08/06/18 1431  BP: (!) 144/102  Pulse: 82  Weight: 139 lb (63 kg)    Fetal Status:     Movement: Present  Presentation: Vertex  General:  Alert, oriented and cooperative. Patient is in no acute distress.  Skin: Skin is warm and dry. No rash noted.   Cardiovascular: Normal heart rate noted  Respiratory: Normal respiratory effort, no problems with respiration noted  Abdomen: Soft, gravid, appropriate for gestational age.  Pain/Pressure: Present     Pelvic: Cervical exam performed Dilation: 4.5 Effacement (%): 80 Station: -1grossly ruptured with clear fluid, fern +  Extremities: Normal range of motion.  Edema: Trace  Mental Status: Normal mood and affect. Normal behavior. Normal judgment and thought content.   Assessment and Plan:  Pregnancy: [id number] at [redacted]w[redacted]d  1. Supervision of other normal pregnancy, antepartum - Admit to labor and delivery  - Pre-eclampsia labs - charge nurse and L&D provider notified   Term labor symptoms and general obstetric precautions  including but not limited to vaginal bleeding, contractions, leaking of fluid and fetal movement were reviewed in detail with the patient. Please refer to After Visit Summary for other counseling recommendations.  Return in about 1 week (around 08/13/2018).  Future Appointments  Date Time Provider [place]  08/12/2018  2:35 PM Tresea Mall, CNM [facility] [place]  08/19/2018  2:15 PM Tresea Mall, CNM [facility] [place]  08/22/2018  1:15 PM [facility] [place] [facility]    Marcille Buffy, CNM

## 2018-08-06 NOTE — Anesthesia Pain Management Evaluation Note (Signed)
  CRNA Pain Management Visit Note  Patient: Theresa Owens, 37 y.o., female  "Hello I am a member of the anesthesia team at [facility]. We have an anesthesia team available at all times to provide care throughout the hospital, including epidural management and anesthesia for C-section. I don't know your plan for the delivery whether it a natural birth, water birth, IV sedation, nitrous supplementation, doula or epidural, but we want to meet your pain goals."   1.Was your pain managed to your expectations on prior hospitalizations?   Yes   2.What is your expectation for pain management during this hospitalization?     Labor support without medications  3.How can we help you reach that goal?   Record the patient's initial score and the patient's pain goal.   Pain: 3  Pain Goal: 10 [facility] wants you to be able to say your pain was always managed very well.  Jabier Mutton 08/06/2018

## 2018-08-07 ENCOUNTER — Encounter (HOSPITAL_COMMUNITY): Payer: Self-pay

## 2018-08-07 DIAGNOSIS — Z3A38 38 weeks gestation of pregnancy: Secondary | ICD-10-CM

## 2018-08-07 LAB — RPR: RPR Ser Ql: NONREACTIVE

## 2018-08-07 MED ORDER — BENZOCAINE-MENTHOL 20-0.5 % EX AERO
1.0000 "application " | INHALATION_SPRAY | CUTANEOUS | Status: DC | PRN
  Filled 2018-08-07: qty 56

## 2018-08-07 MED ORDER — LEVOTHYROXINE SODIUM 100 MCG PO TABS
100.0000 ug | ORAL_TABLET | Freq: Every day | ORAL | Status: DC
  Administered 2018-08-07 – 2018-08-09 (×3): 100 ug via ORAL
  Filled 2018-08-07 (×4): qty 1

## 2018-08-07 MED ORDER — DIPHENHYDRAMINE HCL 25 MG PO CAPS: 25.0000 mg | ORAL_CAPSULE | Freq: Four times a day (QID) | ORAL | Status: DC | PRN

## 2018-08-07 MED ORDER — LEVOTHYROXINE SODIUM 150 MCG PO TABS: 150.0000 ug | ORAL_TABLET | Freq: Every day | ORAL | Status: DC

## 2018-08-07 MED ORDER — ZOLPIDEM TARTRATE 5 MG PO TABS: 5.0000 mg | ORAL_TABLET | Freq: Every evening | ORAL | Status: DC | PRN

## 2018-08-07 MED ORDER — IBUPROFEN 600 MG PO TABS
600.0000 mg | ORAL_TABLET | Freq: Four times a day (QID) | ORAL | Status: DC
  Administered 2018-08-07 – 2018-08-09 (×10): 600 mg via ORAL
  Filled 2018-08-07 (×10): qty 1

## 2018-08-07 MED ORDER — ONDANSETRON HCL 4 MG/2ML IJ SOLN: 4.0000 mg | INTRAMUSCULAR | Status: DC | PRN

## 2018-08-07 MED ORDER — PRENATAL MULTIVITAMIN CH
1.0000 | ORAL_TABLET | Freq: Every day | ORAL | Status: DC
  Administered 2018-08-07 – 2018-08-09 (×3): 1 via ORAL
  Filled 2018-08-07 (×3): qty 1

## 2018-08-07 MED ORDER — DIBUCAINE 1 % RE OINT
1.0000 "application " | TOPICAL_OINTMENT | RECTAL | Status: DC | PRN
  Filled 2018-08-07: qty 28

## 2018-08-07 MED ORDER — COCONUT OIL OIL
1.0000 "application " | TOPICAL_OIL | Status: DC | PRN
  Filled 2018-08-07: qty 120

## 2018-08-07 MED ORDER — SENNOSIDES-DOCUSATE SODIUM 8.6-50 MG PO TABS
2.0000 | ORAL_TABLET | ORAL | Status: DC
  Administered 2018-08-07 – 2018-08-09 (×2): 2 via ORAL
  Filled 2018-08-07 (×2): qty 2

## 2018-08-07 MED ORDER — ONDANSETRON HCL 4 MG PO TABS: 4.0000 mg | ORAL_TABLET | ORAL | Status: DC | PRN

## 2018-08-07 MED ORDER — WITCH HAZEL-GLYCERIN EX PADS: 1.0000 "application " | MEDICATED_PAD | CUTANEOUS | Status: DC | PRN

## 2018-08-07 MED ORDER — ACETAMINOPHEN 325 MG PO TABS
650.0000 mg | ORAL_TABLET | ORAL | Status: DC | PRN
  Administered 2018-08-07 (×2): 650 mg via ORAL
  Filled 2018-08-07 (×2): qty 2

## 2018-08-07 MED ORDER — SIMETHICONE 80 MG PO CHEW: 80.0000 mg | CHEWABLE_TABLET | ORAL | Status: DC | PRN

## 2018-08-07 NOTE — Progress Notes (Signed)
Called MD multiple times regarding synthroid ([phone]) Also told med student in person.  Patient has asked for this medication 3 times now.  Still waiting.

## 2018-08-08 ENCOUNTER — Other Ambulatory Visit: Payer: Self-pay

## 2018-08-08 NOTE — Lactation Note (Addendum)
This note was copied from a baby's chart. Lactation Consultation Note  Patient Name: Theresa Owens VIFBP'P Date: 08/08/2018 Reason for consult: Initial assessment;Primapara;Early term 37-38.6wks;NICU baby  P4 mother whose infant is now 24 hours old.  I had seen a note stating that mother wanted to bottle feed only; went into her room for clarification on what she meant by that comment.Marland KitchenMarland KitchenMarland KitchenDid she want to bottle feed with formula? Or Did she want to bottle feed with EBM in a bottle?  After discussing with mother her choices she wants to pump and bottle feed; no interest in latching baby to breast.  I offered to initiate the DEBP and mother accepted.  Pump parts, assembly, disassembly and cleaning reviewed.  Milk storage times discussed.  Explained to mother that doing hand expression before and after pumping can help increase her milk supply.  Colostrum container provided for any EBM she may obtain with hand expression.  Mother is aware that she can bring her pump parts to NICU and pump at the bedside; explained this may help milk supply.  Mother excited to see a couple drops of colostrum with pumping.  She will pump every 3 hours.  RN updated.  NICU booklet provided.   Maternal Data Formula Feeding for Exclusion: No Has patient been taught Hand Expression?: Yes  Feeding Feeding Type: Formula  LATCH Score                   Interventions    Lactation Tools Discussed/Used Tools: Pump Breast pump type: Double-Electric Breast Pump Pump Review: Setup, frequency, and cleaning;Milk Storage Initiated by:: Quinisha Mould Date initiated:: 08/09/18   Consult Status Consult Status: Follow-up Date: 08/09/18 Follow-up type: In-patient    Theresa Owens R [name] 08/08/2018, 4:58 PM

## 2018-08-08 NOTE — Progress Notes (Signed)
Post Partum Day #1 Subjective:Pt is a 37 yo G5P4014 [redacted]w[redacted]d s/p SVD. Pt is doing well, no significant overnight events. Pain controlled. Normal lochia w/ light vaginal bleeding. Tolerating PO intake, urinating normally, has not had a BM. Denies any nausea, vomiting, blurred vision, or RUQ pain. Plans to breastfeed, desires Nexplanon for contraception. Infant was transferred to NICU overnight for decreased O2 sats.   Objective: Blood pressure 108/71, pulse 63, temperature 97.8 F (36.6 C), temperature source Oral, resp. rate 18, height 5' (1.524 m), weight 63 kg, last menstrual period 11/29/2017, SpO2 96 %, unknown if currently breastfeeding.  Physical Exam:  General: alert, cooperative and no distress Lochia: appropriate Uterine Fundus: firm, below level of umbilicus Incision: N/A DVT Evaluation: No evidence of DVT seen on physical exam.  Recent Labs    08/06/18 1516  HGB 14.6  HCT 44.4    Assessment/Plan: Plan for discharge tomorrow. F/u OP for Nexplanon insertion for [facility].    LOS: 2 days   Theresa Owens Amedeo Plenty 08/08/2018, 9:54 AM

## 2018-08-09 MED ORDER — IBUPROFEN 600 MG PO TABS: 600.0000 mg | ORAL_TABLET | Freq: Four times a day (QID) | ORAL | 0 refills | Status: DC

## 2018-08-09 MED ORDER — DOCUSATE SODIUM 100 MG PO CAPS: 100.0000 mg | ORAL_CAPSULE | Freq: Every day | ORAL | 0 refills | Status: DC | PRN

## 2018-08-09 NOTE — Discharge Summary (Signed)
Postpartum Discharge Summary     Patient Name: Theresa Owens DOB: 1981-06-26 MRN: [id number]  Date of admission: 08/06/2018 Delivering Provider: Matilde Haymaker   Date of discharge: 08/09/2018  Admitting diagnosis: 38wks water broke  Intrauterine pregnancy: [redacted]w[redacted]d     Secondary diagnosis:  Active Problems:   History of thyroid cancer   Hypothyroidism, postsurgical   Normal labor   Vaginal delivery  Additional problems: none     Discharge diagnosis: Term Pregnancy Delivered                                                                                                Post partum procedures: none  Augmentation: Pitocin  Complications: None  Hospital course:  Onset of Labor With Vaginal Delivery     37 y.o. yo [id number] at [redacted]w[redacted]d was admitted in Active Labor on 08/06/2018. Patient had an uncomplicated labor course as follows:  Membrane Rupture Time/Date: 10:00 AM ,08/06/2018   Intrapartum Procedures: Episiotomy: None [1]                                         Lacerations:  None [1]  Patient had a delivery of a Viable infant. 08/07/2018  Information for the patient's newborn:  Sarafina, Puthoff [[id number]]  Delivery Method: Vaginal, Spontaneous(Filed from Delivery Summary)    Pateint had an uncomplicated postpartum course.  She is ambulating, tolerating a regular diet, passing flatus, and urinating well. Patient is discharged home in stable condition on 08/09/18.   Magnesium Sulfate recieved: No BMZ received: No  Physical exam  Vitals:   08/07/18 2303 08/08/18 0530 08/09/18 0052 08/09/18 0500  BP: (!) 98/56 108/71 102/80 118/84  Pulse: 63 63 (!) 59 68  Resp: 16 18    Temp: 97.9 F (36.6 C) 97.8 F (36.6 C) 97.8 F (36.6 C) 98 F (36.7 C)  TempSrc: Oral Oral Oral Oral  SpO2:  96%    Weight:      Height:       General: alert, cooperative and no distress Lochia: appropriate Uterine Fundus: firm Incision: N/A DVT Evaluation: No evidence of DVT seen on physical exam. Negative  Homan's sign. No cords or calf tenderness. No significant calf/ankle edema. Labs: Lab Results  Component Value Date   WBC 8.6 08/06/2018   HGB 14.6 08/06/2018   HCT 44.4 08/06/2018   MCV 74.7 (L) 08/06/2018   PLT 152 08/06/2018   CMP Latest Ref Rng & Units 08/06/2018  Glucose 70 - 99 mg/dL 82  BUN 6 - 20 mg/dL 8  Creatinine 0.44 - 1.00 mg/dL 0.49  Sodium 135 - 145 mmol/L 136  Potassium 3.5 - 5.1 mmol/L 4.2  Chloride 98 - 111 mmol/L 106  CO2 22 - 32 mmol/L 21(L)  Calcium 8.9 - 10.3 mg/dL 9.2  Total Protein 6.5 - 8.1 g/dL 6.4(L)  Total Bilirubin 0.3 - 1.2 mg/dL 0.3  Alkaline Phos 38 - 126 U/L 189(H)  AST 15 - 41 U/L 19  ALT 0 - 44 U/L 13  Discharge instruction: per After Visit Summary and "Baby and Me Booklet".  After visit meds:  Allergies as of 08/09/2018   No Known Allergies     Medication List    TAKE these medications   docusate sodium 100 MG capsule Commonly known as:  COLACE Take 1 capsule (100 mg total) by mouth daily as needed.   ibuprofen 600 MG tablet Commonly known as:  ADVIL,MOTRIN Take 1 tablet (600 mg total) by mouth every 6 (six) hours.   levothyroxine 100 MCG tablet Commonly known as:  SYNTHROID, LEVOTHROID TAKE 1 TABLET(100 MCG) BY MOUTH DAILY BEFORE BREAKFAST   prenatal multivitamin Tabs tablet Take 1 tablet by mouth daily at 12 noon.       Diet: routine diet  Activity: Advance as tolerated. Pelvic rest for 6 weeks.   Outpatient follow up:6 weeks Follow up Appt: Future Appointments  Date Time Provider [place]  09/11/2018  2:35 PM Tamala Julian [place], [place]   Follow up Visit:No follow-ups on file.  Newborn Data: Live born female  Birth Weight: 7 lb 12.5 oz (3530 g) APGAR: 6, 9  Newborn Delivery   Birth date/time:  08/07/2018 00:31:00 Delivery type:  Vaginal, Spontaneous     Baby Feeding: Breast Disposition:home with mother   08/09/2018 Julianne Handler, CNM

## 2018-08-12 ENCOUNTER — Encounter: Payer: Medicaid Other | Admitting: Advanced Practice Midwife

## 2018-08-19 ENCOUNTER — Encounter: Payer: Medicaid Other | Admitting: Advanced Practice Midwife

## 2018-08-22 ENCOUNTER — Other Ambulatory Visit: Payer: Medicaid Other

## 2018-09-10 ENCOUNTER — Other Ambulatory Visit (HOSPITAL_COMMUNITY)
Admission: RE | Admit: 2018-09-10 | Discharge: 2018-09-10 | Disposition: A | Discharge status: Home / Self Care | Payer: Medicaid Other | Source: Ambulatory Visit | Attending: Advanced Practice Midwife | Admitting: Advanced Practice Midwife

## 2018-09-10 ENCOUNTER — Ambulatory Visit (INDEPENDENT_AMBULATORY_CARE_PROVIDER_SITE_OTHER): Payer: Medicaid Other | Admitting: Advanced Practice Midwife

## 2018-09-10 ENCOUNTER — Encounter: Payer: Self-pay | Admitting: Advanced Practice Midwife

## 2018-09-10 LAB — POCT PREGNANCY, URINE: PREG TEST UR: NEGATIVE

## 2018-09-10 NOTE — Patient Instructions (Signed)

## 2018-09-10 NOTE — Progress Notes (Signed)
Subjective:     Theresa Owens is a 37 y.o. female who presents for a postpartum visit. She is 6 weeks postpartum following a spontaneous vaginal delivery. I have fully reviewed the prenatal and intrapartum course. The delivery was at 38.1 gestational weeks. Outcome: spontaneous vaginal delivery. Anesthesia: none. Postpartum course has been unremarkable. Baby's course has been unremarkble. Baby is feeding by bottle Dory Horn. Bleeding staining only. Bowel function is normal. Bladder function is normal. Patient is sexually active. Last intercourse 5 days ago. Did not use contraception. Contraception method is planning Nexplanon. Postpartum depression screening: negative.  The following portions of the patient's history were reviewed and updated as appropriate: allergies, current medications, past family history, past medical history, past social history, past surgical history and problem list.  Review of Systems Pertinent items are noted in HPI.   Objective:    LMP 11/29/2017 (Exact Date)   General:  alert, cooperative, appears stated age and no distress   Breasts:  declined  Lungs: Nml rate and effort  Heart:  regular rate and rhythm  Abdomen: soft, non-tender; bowel sounds normal; no masses,  no organomegaly   Vulva:  normal  Vagina: normal vagina, no discharge, exudate, lesion, or erythema  Cervix:  multiparous appearance, no bleeding following Pap and ectropion present  Corpus: normal size, contour, position, consistency, mobility, non-tender  Adnexa:  no mass, fullness, tenderness  Rectal Exam: Not performed.        Assessment:     Nml postpartum exam. Pap smear done at today's visit.   Plan:    1. Contraception: Cannot be reasonably certain that pt is not pregnant. Return in 9 days for Nexplanon. 2. Condoms given 3. Follow up in: 9 days   Tamala Julian [place], [place] 09/11/2018 11:33 AM

## 2018-09-11 ENCOUNTER — Ambulatory Visit: Payer: Medicaid Other | Admitting: Advanced Practice Midwife

## 2018-09-11 LAB — CYTOLOGY - PAP
Chlamydia: NEGATIVE
Diagnosis: NEGATIVE
Neisseria Gonorrhea: NEGATIVE

## 2018-09-19 ENCOUNTER — Ambulatory Visit (INDEPENDENT_AMBULATORY_CARE_PROVIDER_SITE_OTHER): Payer: Medicaid Other | Admitting: Family Medicine

## 2018-09-19 ENCOUNTER — Encounter: Payer: Self-pay | Admitting: Family Medicine

## 2018-09-19 DIAGNOSIS — Z30017 Encounter for initial prescription of implantable subdermal contraceptive: Secondary | ICD-10-CM

## 2018-09-19 DIAGNOSIS — Z3202 Encounter for pregnancy test, result negative: Secondary | ICD-10-CM | POA: Diagnosis not present

## 2018-09-19 DIAGNOSIS — Z23 Encounter for immunization: Secondary | ICD-10-CM

## 2018-09-19 LAB — POCT PREGNANCY, URINE: PREG TEST UR: NEGATIVE

## 2018-09-19 MED ORDER — ETONOGESTREL 68 MG ~~LOC~~ IMPL
68.0000 mg | DRUG_IMPLANT | Freq: Once | SUBCUTANEOUS | Status: AC
  Administered 2018-09-19: 68 mg via SUBCUTANEOUS

## 2018-09-19 NOTE — Patient Instructions (Signed)
Nexplanon Instructions After Insertion   Keep bandage clean and dry for 24 hours   May use ice/Tylenol/Ibuprofen for soreness or pain   If you develop fever, drainage or increased warmth from incision site-contact office immediately  Etonogestrel implant What is this medicine? ETONOGESTREL (et oh noe JES trel) is a contraceptive (birth control) device. It is used to prevent pregnancy. It can be used for up to 3 years. This medicine may be used for other purposes; ask your health care provider or pharmacist if you have questions. COMMON BRAND NAME(S): Implanon, Nexplanon What should I tell my health care provider before I take this medicine? They need to know if you have any of these conditions: -abnormal vaginal bleeding -blood vessel disease or blood clots -cancer of the breast, cervix, or liver -depression -diabetes -gallbladder disease -headaches -heart disease or recent heart attack -high blood pressure -high cholesterol -kidney disease -liver disease -renal disease -seizures -tobacco smoker -an unusual or allergic reaction to etonogestrel, other hormones, anesthetics or antiseptics, medicines, foods, dyes, or preservatives -pregnant or trying to get pregnant -breast-feeding How should I use this medicine? This device is inserted just under the skin on the inner side of your upper arm by a health care professional. Talk to your pediatrician regarding the use of this medicine in children. Special care may be needed. Overdosage: If you think you have taken too much of this medicine contact a poison control center or emergency room at once. NOTE: This medicine is only for you. Do not share this medicine with others. What if I miss a dose? This does not apply. What may interact with this medicine? Do not take this medicine with any of the following medications: -amprenavir -bosentan -fosamprenavir This medicine may also interact with the following  medications: -barbiturate medicines for inducing sleep or treating seizures -certain medicines for fungal infections like ketoconazole and itraconazole -grapefruit juice -griseofulvin -medicines to treat seizures like carbamazepine, felbamate, oxcarbazepine, phenytoin, topiramate -modafinil -phenylbutazone -rifampin -rufinamide -some medicines to treat HIV infection like atazanavir, indinavir, lopinavir, nelfinavir, tipranavir, ritonavir -St. John's wort This list may not describe all possible interactions. Give your health care provider a list of all the medicines, herbs, non-prescription drugs, or dietary supplements you use. Also tell them if you smoke, drink alcohol, or use illegal drugs. Some items may interact with your medicine. What should I watch for while using this medicine? This product does not protect you against HIV infection (AIDS) or other sexually transmitted diseases. You should be able to feel the implant by pressing your fingertips over the skin where it was inserted. Contact your doctor if you cannot feel the implant, and use a non-hormonal birth control method (such as condoms) until your doctor confirms that the implant is in place. If you feel that the implant may have broken or become bent while in your arm, contact your healthcare provider. What side effects may I notice from receiving this medicine? Side effects that you should report to your doctor or health care professional as soon as possible: -allergic reactions like skin rash, itching or hives, swelling of the face, lips, or tongue -breast lumps -changes in emotions or moods -depressed mood -heavy or prolonged menstrual bleeding -pain, irritation, swelling, or bruising at the insertion site -scar at site of insertion -signs of infection at the insertion site such as fever, and skin redness, pain or discharge -signs of pregnancy -signs and symptoms of a blood clot such as breathing problems; changes in  vision; chest pain; severe,  sudden headache; pain, swelling, warmth in the leg; trouble speaking; sudden numbness or weakness of the face, arm or leg -signs and symptoms of liver injury like dark yellow or brown urine; general ill feeling or flu-like symptoms; light-colored stools; loss of appetite; nausea; right upper belly pain; unusually weak or tired; yellowing of the eyes or skin -unusual vaginal bleeding, discharge -signs and symptoms of a stroke like changes in vision; confusion; trouble speaking or understanding; severe headaches; sudden numbness or weakness of the face, arm or leg; trouble walking; dizziness; loss of balance or coordination Side effects that usually do not require medical attention (report to your doctor or health care professional if they continue or are bothersome): -acne -back pain -breast pain -changes in weight -dizziness -general ill feeling or flu-like symptoms -headache -irregular menstrual bleeding -nausea -sore throat -vaginal irritation or inflammation This list may not describe all possible side effects. Call your doctor for medical advice about side effects. You may report side effects to FDA at 1-800-FDA-1088. Where should I keep my medicine? This drug is given in a hospital or clinic and will not be stored at home. NOTE: This sheet is a summary. It may not cover all possible information. If you have questions about this medicine, talk to your doctor, pharmacist, or health care provider.  2018 Elsevier/Gold Standard (2016-06-08 11:19:22)  

## 2018-09-20 NOTE — Progress Notes (Signed)
     GYNECOLOGY OFFICE PROCEDURE NOTE  Theresa Owens is a 37 y.o. [id number] here for Nexplanon insertion.  Last pap smear was on 09/10/18 and was normal.  No other gynecologic concerns.  Nexplanon Insertion Procedure Patient identified, informed consent performed, consent signed.   Patient does understand that irregular bleeding is a very common side effect of this medication. She was advised to have backup contraception for one week after placement. Pregnancy test in clinic today was negative.  Appropriate time out taken.  Patient's left arm was prepped and draped in the usual sterile fashion. The ruler used to measure and mark insertion area.  Patient was prepped with alcohol swab and then injected with 3 ml of 1% lidocaine.  She was prepped with betadine, Nexplanon removed from packaging,  Device confirmed in needle, then inserted full length of needle and withdrawn per handbook instructions. Nexplanon was able to palpated in the patient's arm; patient palpated the insert herself. There was minimal blood loss.  Patient insertion site covered with guaze and a pressure bandage to reduce any bruising.  The patient tolerated the procedure well and was given post procedure instructions.    [name] 09/20/2018 7:18 AM

## 2018-09-30 ENCOUNTER — Encounter: Payer: Self-pay | Admitting: *Deleted

## 2019-01-20 ENCOUNTER — Other Ambulatory Visit: Payer: Medicaid Other

## 2019-01-20 ENCOUNTER — Other Ambulatory Visit: Payer: Self-pay | Admitting: Obstetrics & Gynecology

## 2019-01-20 DIAGNOSIS — E890 Postprocedural hypothyroidism: Secondary | ICD-10-CM | POA: Diagnosis not present

## 2019-01-20 NOTE — Progress Notes (Signed)
TFTs ordered today because she is here requesting a refill of her synthroid. Also, I have rec'd that she schedule a 2 hour GTT as she had GDM and did not get a follow up test. She has been given a referral [facility].

## 2019-01-21 ENCOUNTER — Telehealth: Payer: Self-pay

## 2019-01-21 ENCOUNTER — Other Ambulatory Visit: Payer: Self-pay | Admitting: Obstetrics & Gynecology

## 2019-01-21 LAB — TSH: TSH: 0.178 u[IU]/mL — ABNORMAL LOW (ref 0.450–4.500)

## 2019-01-21 LAB — T3, FREE: T3, Free: 2.8 pg/mL (ref 2.0–4.4)

## 2019-01-21 LAB — T4, FREE: FREE T4: 1.32 ng/dL (ref 0.82–1.77)

## 2019-01-21 MED ORDER — LEVOTHYROXINE SODIUM 100 MCG PO TABS: ORAL_TABLET | ORAL | 0 refills | Status: DC

## 2019-01-21 NOTE — Telephone Encounter (Signed)
Called pt to give her Dr. Audie Box message about sending more Rx for Synthroid to pts pharmacy, no answer, left VM.

## 2019-01-21 NOTE — Telephone Encounter (Signed)
-----   Message from Emily Filbert, MD sent at 01/21/2019 11:03 AM EST ----- Please let her know that I gave her #60 more synthroid. After that, she needs to get them refilled by her primary care provider.

## 2019-01-21 NOTE — Progress Notes (Signed)
I have refilled her synthroid for 60 days. She was told yesterday to get an appt with her primary care for further refills.

## 2019-02-06 ENCOUNTER — Other Ambulatory Visit: Payer: Self-pay | Admitting: *Deleted

## 2019-02-06 DIAGNOSIS — O24429 Gestational diabetes mellitus in childbirth, unspecified control: Secondary | ICD-10-CM

## 2019-02-10 ENCOUNTER — Other Ambulatory Visit: Payer: Medicaid Other

## 2019-02-10 DIAGNOSIS — O24429 Gestational diabetes mellitus in childbirth, unspecified control: Secondary | ICD-10-CM

## 2019-02-11 LAB — GLUCOSE TOLERANCE, 2 HOURS
Glucose, 2 hour: 106 mg/dL (ref 65–139)
Glucose, GTT - Fasting: 88 mg/dL (ref 65–99)

## 2019-02-12 ENCOUNTER — Telehealth: Payer: Self-pay

## 2019-02-12 NOTE — Telephone Encounter (Addendum)
-----   Message from Woodroe Mode, MD sent at 02/12/2019  8:39 AM EDT ----- Normal GTT  LM for pt that her results are normal and if she has questions to please give the office a call.

## 2019-04-25 ENCOUNTER — Other Ambulatory Visit: Payer: Self-pay | Admitting: Obstetrics & Gynecology

## 2019-04-28 ENCOUNTER — Other Ambulatory Visit: Payer: Self-pay | Admitting: Obstetrics & Gynecology

## 2019-05-08 ENCOUNTER — Other Ambulatory Visit: Payer: Self-pay

## 2019-05-08 ENCOUNTER — Encounter: Payer: Self-pay | Admitting: Internal Medicine

## 2019-05-08 ENCOUNTER — Ambulatory Visit: Payer: Medicaid Other | Attending: Internal Medicine | Admitting: Internal Medicine

## 2019-05-08 VITALS — BP 127/89 | HR 76 | Temp 98.2°F | Resp 16 | Ht 60.0 in | Wt 132.6 lb

## 2019-05-08 DIAGNOSIS — Z8632 Personal history of gestational diabetes: Secondary | ICD-10-CM | POA: Insufficient documentation

## 2019-05-08 DIAGNOSIS — Z79899 Other long term (current) drug therapy: Secondary | ICD-10-CM | POA: Insufficient documentation

## 2019-05-08 DIAGNOSIS — E890 Postprocedural hypothyroidism: Secondary | ICD-10-CM | POA: Insufficient documentation

## 2019-05-08 DIAGNOSIS — R030 Elevated blood-pressure reading, without diagnosis of hypertension: Secondary | ICD-10-CM | POA: Diagnosis not present

## 2019-05-08 DIAGNOSIS — Z8249 Family history of ischemic heart disease and other diseases of the circulatory system: Secondary | ICD-10-CM | POA: Insufficient documentation

## 2019-05-08 DIAGNOSIS — Z85850 Personal history of malignant neoplasm of thyroid: Secondary | ICD-10-CM | POA: Insufficient documentation

## 2019-05-08 MED ORDER — LEVOTHYROXINE SODIUM 100 MCG PO TABS: 100.0000 ug | ORAL_TABLET | Freq: Every day | ORAL | 1 refills | Status: DC

## 2019-05-08 NOTE — Progress Notes (Signed)
Patient ID: Theresa Owens, female    DOB: September 17, 1981  MRN: [id number]  CC: New Patient (Initial Visit)   Subjective: Theresa Owens is a 38 y.o. female who presents for new pt visit and to request thyroid medication Her concerns today include:  History of thyroid cancer s/p thyroidectomy (2005) and gestational diabetes.  She has not had a primary care provider.  Use to go to [facility]  Thyroid: Patient is requesting refill on levothyroxine 100 mcg which she has been on since total thyroidectomy in 2005 for thyroid cancer.  She had a non-encapsulated sclerosing carcinoma.  Used to be followed by endocrinology at [facility] but has not been seen there in several years.  -Out of levothyroxine x1 week. -She has had about 14 pound weight gain since 09/2018.  She denies any palpitations.  No feeling of hot or cold intolerance.  History of gestational diabetes. wgh gain 14 lbs since 09/2018.  Craves sweet, salty, spicy foods.  Not exercising Denies any blurred vision or numbness or tingling in the hands or feet.  No polyuria or polydipsia  Blood pressure noted to be elevated.  She attributes this to being a little nervous.  No prior history of hypertension.  Family history, social history, surgical history reviewed and updated.  Patient Active Problem List   Diagnosis Date Noted  . History of thyroid cancer 02/18/2018  . 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 levothyroxine (SYNTHROID) 100 MCG tablet TAKE 1 TABLET(100 MCG) BY MOUTH DAILY BEFORE BREAKFAST 90 tablet 0  . Prenatal Vit-Fe Fumarate-FA (PRENATAL MULTIVITAMIN) TABS tablet Take 1 tablet by mouth daily at 12 noon.     No current facility-administered medications on file prior to visit.     No Known Allergies  Social History   Socioeconomic History  . Marital  status: Legally Separated    Spouse name: Not on file  . Number of children: 4  . Years of education: 12 grade  . Highest education level: Not on file  Occupational History  . Occupation: nail tech  Social Needs  . Financial resource strain: Not hard at all  . Food insecurity:    Worry: Not on file    Inability: Not on file  . Transportation needs:    Medical: Not on file    Non-medical: Not on file  Tobacco Use  . Smoking status: Never Smoker  . Smokeless tobacco: Never Used  Substance and Sexual Activity  . Alcohol use: No  . Drug use: No  . Sexual activity: Yes    Birth control/protection: None  Lifestyle  . Physical activity:    Days per week: Not on file    Minutes per session: Not on file  . Stress: Not on file  Relationships  . Social connections:    Talks on phone: Not on file    Gets together: Not on file    Attends religious service: Not on file    Active member of club or organization: Not on file    Attends meetings of clubs or organizations: Not on file    Relationship status: Not on file  . Intimate partner violence:    Fear of current or ex partner: Not on file    Emotionally abused: Not on file    Physically abused: Not on file    Forced sexual activity: Not on  file  Other Topics Concern  . Not on file  Social History Narrative  . Not on file    Family History  Problem Relation Age of Onset  . Hypertension Mother   . Hypertension Father     Past Surgical History:  Procedure Laterality Date  . THYROIDECTOMY N/A    2006    ROS: Review of Systems Negative except as stated above  PHYSICAL EXAM: BP 127/89   Pulse 76   Temp 98.2 F (36.8 C) (Oral)   Resp 16   Ht 5' (1.524 m)   Wt 132 lb 9.6 oz (60.1 kg)   SpO2 98%   BMI 25.90 kg/m   Wt Readings from Last 3 Encounters:  05/08/19 132 lb 9.6 oz (60.1 kg)  09/19/18 119 lb (54 kg)  09/10/18 118 lb (53.5 kg)  Repeat blood pressure 127/89  Physical Exam  General appearance - alert,  well appearing, and in no distress Mental status - normal mood, behavior, speech, dress, motor activity, and thought processes Mouth - mucous membranes moist, pharynx normal without lesions Neck - supple, no significant adenopathy.  No abnormal masses appreciated Chest - clear to auscultation, no wheezes, rales or rhonchi, symmetric air entry Heart - normal rate, regular rhythm, normal S1, S2, no murmurs, rubs, clicks or gallops Extremities -no lower extremity edema  CMP Latest Ref Rng & Units 08/06/2018 12/29/2014  Glucose 70 - 99 mg/dL 82 97  BUN 6 - 20 mg/dL 8 -  Creatinine 0.44 - 1.00 mg/dL 0.49 -  Sodium 135 - 145 mmol/L 136 -  Potassium 3.5 - 5.1 mmol/L 4.2 -  Chloride 98 - 111 mmol/L 106 -  CO2 22 - 32 mmol/L 21(L) -  Calcium 8.9 - 10.3 mg/dL 9.2 -  Total Protein 6.5 - 8.1 g/dL 6.4(L) -  Total Bilirubin 0.3 - 1.2 mg/dL 0.3 -  Alkaline Phos 38 - 126 U/L 189(H) -  AST 15 - 41 U/L 19 -  ALT 0 - 44 U/L 13 -   Lipid Panel  No results found for: CHOL, TRIG, HDL, CHOLHDL, VLDL, LDLCALC, LDLDIRECT  CBC    Component Value Date/Time   WBC 8.6 08/06/2018 1516   RBC 5.94 (H) 08/06/2018 1516   HGB 14.6 08/06/2018 1516   HGB 12.3 06/10/2018 1552   HCT 44.4 08/06/2018 1516   HCT 37.8 06/10/2018 1552   PLT 152 08/06/2018 1516   PLT 195 06/10/2018 1552   MCV 74.7 (L) 08/06/2018 1516   MCV 77 (L) 06/10/2018 1552   MCH 24.6 (L) 08/06/2018 1516   MCHC 32.9 08/06/2018 1516   RDW 15.9 (H) 08/06/2018 1516   RDW 14.7 06/10/2018 1552   LYMPHSABS 1.8 02/18/2018 1157   EOSABS 0.2 02/18/2018 1157   BASOSABS 0.0 02/18/2018 1157    ASSESSMENT AND PLAN:  1. History of thyroid cancer 2. Postoperative hypothyroidism -Refilled levothyroxine When she has been back on the medication for about 1 month, patient advised to come to the lab to have thyroid panel done including a thyroglobulin level.  3. History of gestational diabetes We will check A1c. Dietary counseling given. Encourage regular  exercise.  Advised moderate intensity exercise of at least 150 minutes/week  4.  Elevated blood pressure DASH diet discussed and encouraged Patient was given the opportunity to ask questions.  Patient verbalized understanding of the plan and was able to repeat key elements of the plan.   Orders Placed This Encounter  Procedures  . TSH+T4F+T3Free  . Thyroglobulin Level  .  Hemoglobin A1c     Requested Prescriptions   Signed Prescriptions Disp Refills  . levothyroxine (SYNTHROID) 100 MCG tablet 90 tablet 1    Sig: Take 1 tablet (100 mcg total) by mouth daily before breakfast.    Follow-up in 3 months Karle Plumber, MD, [facility]

## 2019-08-26 DIAGNOSIS — Z23 Encounter for immunization: Secondary | ICD-10-CM | POA: Diagnosis not present

## 2019-12-11 ENCOUNTER — Other Ambulatory Visit: Payer: Self-pay

## 2019-12-11 ENCOUNTER — Encounter (HOSPITAL_COMMUNITY): Payer: Self-pay

## 2019-12-11 ENCOUNTER — Ambulatory Visit (HOSPITAL_COMMUNITY)
Admission: EM | Admit: 2019-12-11 | Discharge: 2019-12-11 | Disposition: A | Discharge status: Home / Self Care | Payer: Medicaid Other | Attending: Family Medicine | Admitting: Family Medicine

## 2019-12-11 DIAGNOSIS — R300 Dysuria: Secondary | ICD-10-CM | POA: Diagnosis not present

## 2019-12-11 DIAGNOSIS — Z3202 Encounter for pregnancy test, result negative: Secondary | ICD-10-CM | POA: Diagnosis not present

## 2019-12-11 DIAGNOSIS — R1031 Right lower quadrant pain: Secondary | ICD-10-CM | POA: Insufficient documentation

## 2019-12-11 LAB — POCT URINALYSIS DIP (DEVICE)
Bilirubin Urine: NEGATIVE
Glucose, UA: NEGATIVE mg/dL
Ketones, ur: NEGATIVE mg/dL
Nitrite: NEGATIVE
Protein, ur: NEGATIVE mg/dL
Specific Gravity, Urine: 1.02 (ref 1.005–1.030)
Urobilinogen, UA: 0.2 mg/dL (ref 0.0–1.0)
pH: 7 (ref 5.0–8.0)

## 2019-12-11 LAB — POCT PREGNANCY, URINE: Preg Test, Ur: NEGATIVE

## 2019-12-11 LAB — POC URINE PREG, ED: Preg Test, Ur: NEGATIVE

## 2019-12-11 MED ORDER — NAPROXEN 500 MG PO TABS: 500.0000 mg | ORAL_TABLET | Freq: Two times a day (BID) | ORAL | 0 refills | Status: DC

## 2019-12-11 MED ORDER — CEPHALEXIN 500 MG PO CAPS: 500.0000 mg | ORAL_CAPSULE | Freq: Two times a day (BID) | ORAL | 0 refills | Status: AC

## 2019-12-11 NOTE — ED Provider Notes (Signed)
[place]    CSN: [id number] Arrival date & time: 12/11/19  1016      History   Chief Complaint Chief Complaint  Patient presents with  . Abdominal Pain    HPI Theresa Owens is a 39 y.o. female.   Patient is a 39 year old female that presents today with right lower abdominal discomfort, generalized abdominal bloating that started yesterday evening.  Symptoms been constant, waxing waning.  Last bowel movement was this morning and normal.  Denies any constipation.  She also did start her menstrual cycle this morning.  Denies any associated nausea or vomiting.  Denies any fevers.  She has had some mild dysuria and vaginal discharge without itching or irritation.  Currently sexually active but not concerned for STDs.  ROS per HPI      Past Medical History:  Diagnosis Date  . History of gestational diabetes    Normal postpartum 2 hr GTT  . Hypothyroidism     Patient Active Problem List   Diagnosis Date Noted  . History of thyroid cancer 02/18/2018  . 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 THYROIDECTOMY N/A    2006    OB History    Gravida  5   Para  4   Term  4   Preterm  0   AB  1   Living  4     SAB  1   TAB  0   Ectopic  0   Multiple  0   Live Births  4            Home Medications    Prior to Admission medications   Medication Sig Start Date End Date Taking? Authorizing Provider  cephALEXin (KEFLEX) 500 MG capsule Take 1 capsule (500 mg total) by mouth 2 (two) times daily for 5 days. 12/11/19 12/16/19  Loura Halt A, NP  levothyroxine (SYNTHROID) 100 MCG tablet TAKE 1 TABLET(100 MCG) BY MOUTH DAILY BEFORE BREAKFAST 04/30/19   Emily Filbert, MD  levothyroxine (SYNTHROID) 100 MCG tablet Take 1 tablet (100 mcg total) by mouth daily before breakfast. 05/08/19   Ladell Pier, MD  naproxen (NAPROSYN) 500 MG tablet Take 1  tablet (500 mg total) by mouth 2 (two) times daily. 12/11/19   Orvan July, NP  Prenatal Vit-Fe Fumarate-FA (PRENATAL MULTIVITAMIN) TABS tablet Take 1 tablet by mouth daily at 12 noon.    [provider]    Family History Family History  Problem Relation Age of Onset  . Hypertension Mother   . Hypertension Father     Social History Social History   Tobacco Use  . Smoking status: Never Smoker  . Smokeless tobacco: Never Used  Substance Use Topics  . Alcohol use: No  . Drug use: No     Allergies   Patient has no known allergies.   Review of Systems Review of Systems   Physical Exam Triage Vital Signs ED Triage Vitals  Enc Vitals Group     BP 12/11/19 1050 131/87     Pulse Rate 12/11/19 1050 76     Resp 12/11/19 1050 16     Temp 12/11/19 1050 98.7 F (37.1 C)     Temp Source 12/11/19 1050 Oral     SpO2 12/11/19 1050 97 %     Weight 12/11/19 1049 135 lb (61.2 kg)     Height --  Head Circumference --      Peak Flow --      Pain Score 12/11/19 1049 6     Pain Loc --      Pain Edu? --      Excl. in [place]? --    No data found.  Updated Vital Signs BP 131/87 (BP Location: Right Arm)   Pulse 76   Temp 98.7 F (37.1 C) (Oral)   Resp 16   Wt 135 lb (61.2 kg)   LMP 12/11/2019   SpO2 97%   BMI 26.37 kg/m   Visual Acuity Right Eye Distance:   Left Eye Distance:   Bilateral Distance:    Right Eye Near:   Left Eye Near:    Bilateral Near:     Physical Exam Vitals and nursing note reviewed.  Constitutional:      General: She is not in acute distress.    Appearance: Normal appearance. She is well-developed. She is not ill-appearing, toxic-appearing or diaphoretic.  HENT:     Head: Normocephalic and atraumatic.     Nose: Nose normal.     Mouth/Throat:     Pharynx: Oropharynx is clear.  Eyes:     Conjunctiva/sclera: Conjunctivae normal.  Cardiovascular:     Rate and Rhythm: Normal rate and regular rhythm.  Pulmonary:     Effort: Pulmonary  effort is normal.  Abdominal:     General: Abdomen is flat. Bowel sounds are normal.     Palpations: Abdomen is soft.     Tenderness: There is abdominal tenderness in the right lower quadrant. There is no guarding or rebound.  Musculoskeletal:        General: Normal range of motion.     Cervical back: Normal range of motion.  Skin:    General: Skin is warm and dry.     Findings: No rash.  Neurological:     Mental Status: She is alert.  Psychiatric:        Mood and Affect: Mood normal.      UC Treatments / Results  Labs (all labs ordered are listed, but only abnormal results are displayed) Labs Reviewed  POCT URINALYSIS DIP (DEVICE) - Abnormal; Notable for the following components:      Result Value   Hgb urine dipstick LARGE (*)    Leukocytes,Ua TRACE (*)    All other components within normal limits  URINE CULTURE  POC URINE PREG, ED  POCT PREGNANCY, URINE  CERVICOVAGINAL ANCILLARY ONLY    EKG   Radiology No results found.  Procedures Procedures (including critical care time)  Medications Ordered in UC Medications - No data to display  Initial Impression / Assessment and Plan / UC Course  I have reviewed the triage vital signs and the nursing notes.  Pertinent labs & imaging results that were available during my care of the patient were reviewed by me and considered in my medical decision making (see chart for details).     Right lower quadrant abdominal pain with dysuria and vaginal discharge.-Urine with trace leuks and large hemoglobin.  large hemoglobin most likely  due to patient being on menstrual cycle.  Will send urine for culture. Will treat with Keflex in meantime pending culture Also sending swab for further testing to rule out infection. Sending naproxen for pain inflammation Other differentials include ovarian cyst, constipation.  Tubal pregnancy and appendicitis less likely. Pregnancy test negative.  Recommend if symptoms worsen to include more  severe abdominal pain she would need to go  to the ER.  Patient understanding and agree. Final Clinical Impressions(s) / UC Diagnoses   Final diagnoses:  Right lower quadrant abdominal pain  Dysuria     Discharge Instructions     You have some bacteria in your urine.  I am sending for culture.  There is also blood but this could be from your period.  You are not pregnant.  I will go ahead and treat you for a urinary tract infection today.  Naproxen for pain and inflammation.  You may have an ovarian cyst or this could be menstrual pain.  If your symptoms continue or pain worsens you will need to go to the ER.      ED Prescriptions    Medication Sig Dispense Auth. Provider   cephALEXin (KEFLEX) 500 MG capsule Take 1 capsule (500 mg total) by mouth 2 (two) times daily for 5 days. 10 capsule Cressida Milford A, NP   naproxen (NAPROSYN) 500 MG tablet Take 1 tablet (500 mg total) by mouth 2 (two) times daily. 30 tablet Loura Halt A, NP     PDMP not reviewed this encounter.   Orvan July, NP 12/11/19 1307

## 2019-12-11 NOTE — Discharge Instructions (Addendum)
You have some bacteria in your urine.  I am sending for culture.  There is also blood but this could be from your period.  You are not pregnant.  I will go ahead and treat you for a urinary tract infection today.  Naproxen for pain and inflammation.  You may have an ovarian cyst or this could be menstrual pain.  If your symptoms continue or pain worsens you will need to go to the ER.

## 2019-12-11 NOTE — ED Triage Notes (Signed)
Pt states she has right side abdominal pain pt states it's very sore. Pt states she has stomach is bloated. This started last night.

## 2019-12-13 LAB — URINE CULTURE: Culture: 80000 — AB

## 2019-12-15 ENCOUNTER — Telehealth (HOSPITAL_COMMUNITY): Payer: Self-pay | Admitting: Emergency Medicine

## 2019-12-15 LAB — CERVICOVAGINAL ANCILLARY ONLY
Bacterial vaginitis: POSITIVE — AB
Candida vaginitis: NEGATIVE
Chlamydia: NEGATIVE
Neisseria Gonorrhea: NEGATIVE
Trichomonas: NEGATIVE

## 2019-12-15 MED ORDER — METRONIDAZOLE 500 MG PO TABS: 500.0000 mg | ORAL_TABLET | Freq: Two times a day (BID) | ORAL | 0 refills | Status: AC

## 2019-12-15 NOTE — Telephone Encounter (Signed)
Bacterial vaginosis is positive. Pt needs treatment. Flagyl 500 mg BID x 7 days #14 no refills sent to patients pharmacy of choice.    Attempted to reach patient. No answer at this time. Call cannot be completed.

## 2019-12-16 ENCOUNTER — Telehealth (HOSPITAL_COMMUNITY): Payer: Self-pay | Admitting: Emergency Medicine

## 2019-12-16 NOTE — Telephone Encounter (Signed)
Patient contacted by phone and made aware of  bv  results. Pt verbalized understanding and had all questions answered.

## 2020-05-06 ENCOUNTER — Other Ambulatory Visit: Payer: Self-pay | Admitting: Internal Medicine

## 2020-05-06 DIAGNOSIS — E890 Postprocedural hypothyroidism: Secondary | ICD-10-CM

## 2020-05-12 ENCOUNTER — Telehealth: Payer: Self-pay | Admitting: Internal Medicine

## 2020-05-12 NOTE — Telephone Encounter (Signed)
Pt needs refill prescription for Levothyroxine 100 mcg to [facility] on [address]

## 2020-05-17 ENCOUNTER — Other Ambulatory Visit: Payer: Self-pay | Admitting: Pharmacist

## 2020-05-17 ENCOUNTER — Other Ambulatory Visit: Payer: Self-pay | Admitting: Internal Medicine

## 2020-05-17 DIAGNOSIS — E890 Postprocedural hypothyroidism: Secondary | ICD-10-CM

## 2020-05-17 MED ORDER — LEVOTHYROXINE SODIUM 100 MCG PO TABS: 100.0000 ug | ORAL_TABLET | Freq: Every day | ORAL | 0 refills | Status: DC

## 2020-06-10 ENCOUNTER — Other Ambulatory Visit: Payer: Self-pay | Admitting: Internal Medicine

## 2020-06-10 DIAGNOSIS — E890 Postprocedural hypothyroidism: Secondary | ICD-10-CM

## 2020-06-14 ENCOUNTER — Other Ambulatory Visit: Payer: Self-pay

## 2020-06-14 ENCOUNTER — Ambulatory Visit: Payer: Medicaid Other | Attending: Internal Medicine | Admitting: Internal Medicine

## 2020-06-14 ENCOUNTER — Encounter: Payer: Self-pay | Admitting: Internal Medicine

## 2020-06-14 VITALS — BP 118/83 | HR 80 | Temp 97.6°F | Resp 16 | Ht 60.0 in | Wt 137.0 lb

## 2020-06-14 DIAGNOSIS — Z85850 Personal history of malignant neoplasm of thyroid: Secondary | ICD-10-CM

## 2020-06-14 DIAGNOSIS — Z7189 Other specified counseling: Secondary | ICD-10-CM

## 2020-06-14 DIAGNOSIS — E663 Overweight: Secondary | ICD-10-CM

## 2020-06-14 DIAGNOSIS — Z8632 Personal history of gestational diabetes: Secondary | ICD-10-CM

## 2020-06-14 DIAGNOSIS — E890 Postprocedural hypothyroidism: Secondary | ICD-10-CM | POA: Diagnosis not present

## 2020-06-14 MED ORDER — LEVOTHYROXINE SODIUM 100 MCG PO TABS: ORAL_TABLET | ORAL | 6 refills | Status: DC

## 2020-06-14 NOTE — Patient Instructions (Signed)
Preventing Unhealthy Weight Gain, Adult Staying at a healthy weight is important to your overall health. When fat builds up in your body, you may become overweight or obese. Being overweight or obese increases your risk of developing certain health problems, such as heart disease, diabetes, sleeping problems, joint problems, and some types of cancer. Unhealthy weight gain is often the result of making unhealthy food choices or not getting enough exercise. You can make changes to your lifestyle to prevent obesity and stay as healthy as possible. What nutrition changes can be made?   Eat only as much as your body needs. To do this: ? Pay attention to signs that you are hungry or full. Stop eating as soon as you feel full. ? If you feel hungry, try drinking water first before eating. Drink enough water so your urine is clear or pale yellow. ? Eat smaller portions. Pay attention to portion sizes when eating out. ? Look at serving sizes on food labels. Most foods contain more than one serving per container. ? Eat the recommended number of calories for your gender and activity level. For most active people, a daily total of 2,000 calories is appropriate. If you are trying to lose weight or are not very active, you may need to eat fewer calories. Talk with your health care provider or a diet and nutrition specialist (dietitian) about how many calories you need each day.  Choose healthy foods, such as: ? Fruits and vegetables. At each meal, try to fill at least half of your plate with fruits and vegetables. ? Whole grains, such as whole-wheat bread, brown rice, and quinoa. ? Lean meats, such as chicken or fish. ? Other healthy proteins, such as beans, eggs, or tofu. ? Healthy fats, such as nuts, seeds, fatty fish, and olive oil. ? Low-fat or fat-free dairy products.  Check food labels, and avoid food and drinks that: ? Are high in calories. ? Have added sugar. ? Are high in sodium. ? Have saturated  fats or trans fats.  Cook foods in healthier ways, such as by baking, broiling, or grilling.  Make a meal plan for the week, and shop with a grocery list to help you stay on track with your purchases. Try to avoid going to the grocery store when you are hungry.  When grocery shopping, try to shop around the outside of the store first, where the fresh foods are. Doing this helps you to avoid prepackaged foods, which can be high in sugar, salt (sodium), and fat. What lifestyle changes can be made?   Exercise for 30 or more minutes on 5 or more days each week. Exercising may include brisk walking, yard work, biking, running, swimming, and team sports like basketball and soccer. Ask your health care provider which exercises are safe for you.  Do muscle-strengthening activities, such as lifting weights or using resistance bands, on 2 or more days a week.  Do not use any products that contain nicotine or tobacco, such as cigarettes and e-cigarettes. If you need help quitting, ask your health care provider.  Limit alcohol intake to no more than 1 drink a day for nonpregnant women and 2 drinks a day for men. One drink equals 12 oz of beer, 5 oz of wine, or 1 oz of hard liquor.  Try to get 7-9 hours of sleep each night. What other changes can be made?  Keep a food and activity journal to keep track of: ? What you ate and how many calories   you had. Remember to count the calories in sauces, dressings, and side dishes. ? Whether you were active, and what exercises you did. ? Your calorie, weight, and activity goals.  Check your weight regularly. Track any changes. If you notice you have gained weight, make changes to your diet or activity routine.  Avoid taking weight-loss medicines or supplements. Talk to your health care provider before starting any new medicine or supplement.  Talk to your health care provider before trying any new diet or exercise plan. Why are these changes  important? Eating healthy, staying active, and having healthy habits can help you to prevent obesity. Those changes also:  Help you manage stress and emotions.  Help you connect with friends and family.  Improve your self-esteem.  Improve your sleep.  Prevent long-term health problems. What can happen if changes are not made? Being obese or overweight can cause you to develop joint or bone problems, which can make it hard for you to stay active or do activities you enjoy. Being obese or overweight also puts stress on your heart and lungs and can lead to health problems like diabetes, heart disease, and some cancers. Where to find more information Talk with your health care provider or a dietitian about healthy eating and healthy lifestyle choices. You may also find information from:  U.S. Department of Agriculture, MyPlate: www.choosemyplate.gov  American Heart Association: www.heart.org  Centers for Disease Control and Prevention: www.cdc.gov Summary  Staying at a healthy weight is important to your overall health. It helps you to prevent certain diseases and health problems, such as heart disease, diabetes, joint problems, sleep disorders, and some types of cancer.  Being obese or overweight can cause you to develop joint or bone problems, which can make it hard for you to stay active or do activities you enjoy.  You can prevent unhealthy weight gain by eating a healthy diet, exercising regularly, not smoking, limiting alcohol, and getting enough sleep.  Talk with your health care provider or a dietitian for guidance about healthy eating and healthy lifestyle choices. This information is not intended to replace advice given to you by your health care provider. Make sure you discuss any questions you have with your health care provider. Document Revised: 11/23/2017 Document Reviewed: 12/27/2016 Elsevier Patient Education  2020 Elsevier Inc.  

## 2020-06-14 NOTE — Progress Notes (Signed)
Patient ID: Theresa Owens, female    DOB: 1981/10/10  MRN: [id number]  CC: No chief complaint on file.   Subjective: Theresa Owens is a 39 y.o. female who presents for chronic ds management Her concerns today include:  History of thyroid cancer s/p thyroidectomy (non-encapsulated sclerosing carcinoma. 2005) and gestational diabetes.  Hypothyroid:  Tired sometimes during the day if she does not get enough sleep. Some hair loss which is chronic.  No diarrhea or constipation.  No major wgh changes Compliant with Levothyroxine 100 mcg. Not getting in much exercise due to work schedule.  Feels she does okay with eating habits but eats too late at night.  HM:  No Covid vaccine as yet. Afraid that it may interfere with her thyroid disease Patient Active Problem List   Diagnosis Date Noted  . History of thyroid cancer 02/18/2018  . 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 naproxen (NAPROSYN) 500 MG tablet Take 1 tablet (500 mg total) by mouth 2 (two) times daily. 30 tablet 0  . Prenatal Vit-Fe Fumarate-FA (PRENATAL MULTIVITAMIN) TABS tablet Take 1 tablet by mouth daily at 12 noon.     No current facility-administered medications on file prior to visit.    No Known Allergies  Social History   Socioeconomic History  . Marital status: Legally Separated    Spouse name: Not on file  . Number of children: 4  . Years of education: 12 grade  . Highest education level: Not on file  Occupational History  . Occupation: nail tech  Tobacco Use  . Smoking status: Never Smoker  . Smokeless tobacco: Never Used  Vaping Use  . Vaping Use: Never used  Substance and Sexual Activity  . Alcohol use: No  . Drug use: No  . Sexual activity: Yes    Birth control/protection: None  Other Topics Concern  . Not on file  Social History Narrative  . Not on  file   Social Determinants of Health   Financial Resource Strain:   . Difficulty of Paying Living Expenses:   Food Insecurity:   . Worried About Charity fundraiser in the Last Year:   . Arboriculturist in the Last Year:   Transportation Needs:   . Film/video editor (Medical):   Marland Kitchen Lack of Transportation (Non-Medical):   Physical Activity:   . Days of Exercise per Week:   . Minutes of Exercise per Session:   Stress:   . Feeling of Stress :   Social Connections:   . Frequency of Communication with Friends and Family:   . Frequency of Social Gatherings with Friends and Family:   . Attends Religious Services:   . Active Member of Clubs or Organizations:   . Attends Archivist Meetings:   Marland Kitchen Marital Status:   Intimate Partner Violence:   . Fear of Current or Ex-Partner:   . Emotionally Abused:   Marland Kitchen Physically Abused:   . Sexually Abused:     Family History  Problem Relation Age of Onset  . Hypertension Mother   . Hypertension Father     Past Surgical History:  Procedure Laterality Date  . THYROIDECTOMY N/A    2006    ROS: Review of Systems Negative except as stated above  PHYSICAL EXAM: BP 118/83   Pulse 80   Temp 97.6 F (36.4 C)  Resp 16   Ht 5' (1.524 m)   Wt 137 lb (62.1 kg)   LMP 06/03/2020 (LMP Unknown)   SpO2 97%   BMI 26.76 kg/m   Wt Readings from Last 3 Encounters:  06/14/20 137 lb (62.1 kg)  12/11/19 135 lb (61.2 kg)  05/08/19 132 lb 9.6 oz (60.1 kg)    Physical Exam  General appearance - alert, well appearing, young female and in no distress Mental status - normal mood, behavior, speech, dress, motor activity, and thought processes Mouth - mucous membranes moist, pharynx normal without lesions Neck - supple, no significant adenopathy Chest - clear to auscultation, no wheezes, rales or rhonchi, symmetric air entry Heart - normal rate, regular rhythm, normal S1, S2, no murmurs, rubs, clicks or gallops Extremities - peripheral  pulses normal, no pedal edema, no clubbing or cyanosis   CMP Latest Ref Rng & Units 08/06/2018 12/29/2014  Glucose 70 - 99 mg/dL 82 97  BUN 6 - 20 mg/dL 8 -  Creatinine 0.44 - 1.00 mg/dL 0.49 -  Sodium 135 - 145 mmol/L 136 -  Potassium 3.5 - 5.1 mmol/L 4.2 -  Chloride 98 - 111 mmol/L 106 -  CO2 22 - 32 mmol/L 21(L) -  Calcium 8.9 - 10.3 mg/dL 9.2 -  Total Protein 6.5 - 8.1 g/dL 6.4(L) -  Total Bilirubin 0.3 - 1.2 mg/dL 0.3 -  Alkaline Phos 38 - 126 U/L 189(H) -  AST 15 - 41 U/L 19 -  ALT 0 - 44 U/L 13 -   Lipid Panel  No results found for: CHOL, TRIG, HDL, CHOLHDL, VLDL, LDLCALC, LDLDIRECT  CBC    Component Value Date/Time   WBC 8.6 08/06/2018 1516   RBC 5.94 (H) 08/06/2018 1516   HGB 14.6 08/06/2018 1516   HGB 12.3 06/10/2018 1552   HCT 44.4 08/06/2018 1516   HCT 37.8 06/10/2018 1552   PLT 152 08/06/2018 1516   PLT 195 06/10/2018 1552   MCV 74.7 (L) 08/06/2018 1516   MCV 77 (L) 06/10/2018 1552   MCH 24.6 (L) 08/06/2018 1516   MCHC 32.9 08/06/2018 1516   RDW 15.9 (H) 08/06/2018 1516   RDW 14.7 06/10/2018 1552   LYMPHSABS 1.8 02/18/2018 1157   EOSABS 0.2 02/18/2018 1157   BASOSABS 0.0 02/18/2018 1157    ASSESSMENT AND PLAN: 1. Postoperative hypothyroidism Continue levothyroxine. - TSH+T4F+T3Free - Thyroglobulin Level - levothyroxine (SYNTHROID) 100 MCG tablet; TAKE 1 TABLET(100 MCG) BY MOUTH DAILY BEFORE BREAKFAST  Dispense: 30 tablet; Refill: 6  2. History of thyroid cancer - TSH+T4F+T3Free - Thyroglobulin Level  3. History of gestational diabetes Patient agreeable to diabetes screening today - Hemoglobin A1c  4. Overweight (BMI 25.0-29.9) Dietary counseling given.  Encourage patient to get in some form of moderate intensity exercise with a goal of 150 minutes total per week - Hemoglobin A1c - CBC - Comprehensive metabolic panel  5. Educated about COVID-19 virus infection Discussed COVID-19 vaccines with her.  Patient informed that the vaccines are  relatively safe and effective.  I have encouraged her to get the vaccine.  She states that she will do so.     Patient was given the opportunity to ask questions.  Patient verbalized understanding of the plan and was able to repeat key elements of the plan.   Orders Placed This Encounter  Procedures  . TSH+T4F+T3Free  . Thyroglobulin Level  . Hemoglobin A1c  . CBC  . Comprehensive metabolic panel     Requested Prescriptions   Signed  Prescriptions Disp Refills  . levothyroxine (SYNTHROID) 100 MCG tablet 30 tablet 6    Sig: TAKE 1 TABLET(100 MCG) BY MOUTH DAILY BEFORE BREAKFAST    Return in about 6 months (around 12/15/2020).  Karle Plumber, MD, FACP

## 2020-06-14 NOTE — Progress Notes (Signed)
Here for thyoid f /u

## 2020-06-15 ENCOUNTER — Telehealth: Payer: Self-pay | Admitting: Internal Medicine

## 2020-06-15 DIAGNOSIS — E890 Postprocedural hypothyroidism: Secondary | ICD-10-CM

## 2020-06-15 NOTE — Telephone Encounter (Signed)
Phone call placed to patient today.  Patient informed that her TSH level is much lower than what is recommended.  I reviewed the endocrinologist note from when she was being seen at [facility].  The goal was to keep the TSH between 0.4 5-1.  Her current reading is 0.031.  I recommend that we decrease the levothyroxine from 100 mcg daily to 75 mcg daily.  After being on the lower dose for 4 to 6 weeks, advised that she return to the lab to have TSH rechecked.  Patient expressed understanding of the plan and was able to repeat back these instructions to me.  Her other lab tests reveals that she does not have diabetes.  Kidney and liver function tests normal.  CBC normal.

## 2020-06-15 NOTE — Telephone Encounter (Signed)
Pt went to pharmacy but they only had Rx for levothyroxine (SYNTHROID) 100 MCG tablet And Pt was decreased to 75 MCG this morning/ Pt would like to pick up medication/ please advise

## 2020-06-16 ENCOUNTER — Other Ambulatory Visit: Payer: Self-pay | Admitting: Internal Medicine

## 2020-06-16 DIAGNOSIS — E890 Postprocedural hypothyroidism: Secondary | ICD-10-CM

## 2020-06-16 MED ORDER — LEVOTHYROXINE SODIUM 75 MCG PO TABS: ORAL_TABLET | ORAL | 5 refills | Status: DC

## 2020-06-16 NOTE — Telephone Encounter (Signed)
Contacted pt and went over Dr. Johnson message pt doesn't have any questions or concerns  

## 2020-06-22 LAB — HEMOGLOBIN A1C
Est. average glucose Bld gHb Est-mCnc: 103 mg/dL
Hgb A1c MFr Bld: 5.2 % (ref 4.8–5.6)

## 2020-06-22 LAB — COMPREHENSIVE METABOLIC PANEL
ALT: 28 IU/L (ref 0–32)
AST: 22 IU/L (ref 0–40)
Albumin/Globulin Ratio: 1.6 (ref 1.2–2.2)
Albumin: 4.5 g/dL (ref 3.8–4.8)
Alkaline Phosphatase: 75 IU/L (ref 48–121)
BUN/Creatinine Ratio: 14 (ref 9–23)
BUN: 12 mg/dL (ref 6–20)
Bilirubin Total: 0.5 mg/dL (ref 0.0–1.2)
CO2: 23 mmol/L (ref 20–29)
Calcium: 9.6 mg/dL (ref 8.7–10.2)
Chloride: 103 mmol/L (ref 96–106)
Creatinine, Ser: 0.84 mg/dL (ref 0.57–1.00)
GFR calc Af Amer: 103 mL/min/{1.73_m2} (ref >59)
GFR calc non Af Amer: 90 mL/min/{1.73_m2} (ref >59)
Globulin, Total: 2.8 g/dL (ref 1.5–4.5)
Glucose: 95 mg/dL (ref 65–99)
Potassium: 4.1 mmol/L (ref 3.5–5.2)
Sodium: 137 mmol/L (ref 134–144)
Total Protein: 7.3 g/dL (ref 6.0–8.5)

## 2020-06-22 LAB — TSH+T4F+T3FREE
Free T4: 1.63 ng/dL (ref 0.82–1.77)
T3, Free: 3 pg/mL (ref 2.0–4.4)
TSH: 0.031 u[IU]/mL — ABNORMAL LOW (ref 0.450–4.500)

## 2020-06-22 LAB — CBC
Hematocrit: 45 % (ref 34.0–46.6)
Hemoglobin: 14.6 g/dL (ref 11.1–15.9)
MCH: 27.2 pg (ref 26.6–33.0)
MCHC: 32.4 g/dL (ref 31.5–35.7)
MCV: 84 fL (ref 79–97)
Platelets: 211 10*3/uL (ref 150–450)
RBC: 5.36 x10E6/uL — ABNORMAL HIGH (ref 3.77–5.28)
RDW: 12.7 % (ref 11.7–15.4)
WBC: 8.2 10*3/uL (ref 3.4–10.8)

## 2020-06-22 LAB — THYROGLOBULIN LEVEL: Thyroglobulin (TG-RIA): <2 ng/mL

## 2020-07-20 ENCOUNTER — Other Ambulatory Visit: Payer: Self-pay

## 2020-07-20 ENCOUNTER — Ambulatory Visit: Payer: Medicaid Other | Attending: Internal Medicine

## 2020-07-20 DIAGNOSIS — E890 Postprocedural hypothyroidism: Secondary | ICD-10-CM

## 2020-07-21 ENCOUNTER — Telehealth: Payer: Self-pay | Admitting: Internal Medicine

## 2020-07-21 ENCOUNTER — Other Ambulatory Visit: Payer: Self-pay | Admitting: Internal Medicine

## 2020-07-21 DIAGNOSIS — E890 Postprocedural hypothyroidism: Secondary | ICD-10-CM

## 2020-07-21 LAB — TSH: TSH: 0.066 u[IU]/mL — ABNORMAL LOW (ref 0.450–4.500)

## 2020-07-21 MED ORDER — LEVOTHYROXINE SODIUM 50 MCG PO TABS: ORAL_TABLET | ORAL | 2 refills | Status: DC

## 2020-07-21 NOTE — Telephone Encounter (Signed)
Phone call placed to patient today.  Patient informed that her thyroid level has improved but not where it needs to be.  4 weeks ago we had decreased the levothyroxine from 100 mcg daily to 75 mcg daily.  Repeat TSH level yesterday was 0.066.  He was last seen by the endocrinologist at [facility] in 2018, per their note, the goal for her TSH is between 0.45-1 given her history of thyroid cancer status post thyroidectomy. Advised patient that we should decrease levothyroxine to 50 mcg daily.  She should return to the lab in 6 weeks to have TSH level rechecked.  I also recommend that we refer her to the endocrinologist.  Prefers to see an endocrinologist in [place] as it would be closer for her.  Referral submitted.  Patient expressed understanding and was able to repeat back the essential points of the conversation.

## 2020-08-23 ENCOUNTER — Encounter: Payer: Self-pay | Admitting: Internal Medicine

## 2020-08-23 ENCOUNTER — Other Ambulatory Visit: Payer: Self-pay

## 2020-08-23 ENCOUNTER — Ambulatory Visit: Payer: Medicaid Other | Admitting: Internal Medicine

## 2020-08-23 VITALS — BP 118/72 | HR 72 | Temp 98.1°F | Ht 60.0 in | Wt 135.8 lb

## 2020-08-23 DIAGNOSIS — Z85850 Personal history of malignant neoplasm of thyroid: Secondary | ICD-10-CM | POA: Diagnosis not present

## 2020-08-23 DIAGNOSIS — E890 Postprocedural hypothyroidism: Secondary | ICD-10-CM

## 2020-08-23 NOTE — Progress Notes (Signed)
Name: Theresa Owens  MRN/ DOB: [id number], 1981-04-05    Age/ Sex: 39 y.o., female    PCP: Ladell Pier, MD   Reason for Endocrinology Evaluation: Postoperative hypothyroidism     Date of Initial Endocrinology Evaluation: 08/23/2020     HPI: Ms. Theresa Owens is a 39 y.o. female with a past medical history of hx of thyroid cancer. 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  No prior exposure to radiation    No Biotin intake  Takes levothyroxine appropriate  No plan to conceive anytime soon    HISTORY:  Past Medical History:  Past Medical History:  Diagnosis Date  . History of gestational diabetes    Normal postpartum 2 hr GTT  . Hypothyroidism     Past Surgical History:  Past Surgical History:  Procedure Laterality Date  . THYROIDECTOMY N/A    2006      Social History:  reports that she has never smoked. She has never used smokeless tobacco. She reports that she does not drink alcohol and does not use drugs.  Family History: family history includes Hypertension in her father and mother.   HOME MEDICATIONS: Allergies as of 08/23/2020   No Known Allergies     Medication List       Accurate as of August 23, 2020  7:12 AM. If you have any questions, ask your nurse or doctor.        levothyroxine 50 MCG tablet Commonly known as: SYNTHROID TAKE 1 TABLET(100 MCG) BY MOUTH DAILY BEFORE BREAKFAST   naproxen 500 MG tablet Commonly known as: NAPROSYN Take 1 tablet (500 mg total) by mouth 2 (two) times daily.   prenatal multivitamin Tabs tablet Take 1 tablet by mouth daily at 12 noon.         REVIEW OF SYSTEMS: A comprehensive ROS was conducted with the  patient and is negative except as per HPI and below:  ROS     OBJECTIVE:  VS: BP 118/72 (BP Location: Left Arm, Patient Position: Sitting, Cuff Size: Normal)   Pulse 72   Temp 98.1 F (36.7 C) (Oral)   Ht 5' (1.524 m)   Wt 135 lb 12.8 oz (61.6 kg)   BMI 26.52 kg/m    Wt Readings from Last 3 Encounters:  06/14/20 137 lb (62.1 kg)  12/11/19 135 lb (61.2 kg)  05/08/19 132 lb 9.6 oz (60.1 kg)     EXAM: General: Pt appears well and is in NAD  Neck: General: Supple without adenopathy. Thyroid: No goiter or nodules appreciated.  Lungs: Clear with good BS bilat with no rales, rhonchi, or wheezes  Heart: Auscultation: RRR.  Abdomen: Normoactive bowel sounds, soft, nontender, without masses or organomegaly palpable  Extremities:  BL LE: No pretibial edema normal ROM and strength.  Skin: Hair: Texture and amount normal with gender appropriate distribution Skin Inspection: No rashes Skin Palpation: Skin temperature, texture, and thickness normal to palpation  Neuro: Cranial nerves: II - XII grossly intact  Motor: Normal strength throughout DTRs: 2+ and symmetric in UE without delay in relaxation phase  Mental Status: Judgment, insight: Intact Orientation: Oriented to time, place, and person Mood and affect: No depression, anxiety, or agitation     DATA REVIEWED:  Results for Theresa, Owens (MRN [id number]) as of 08/23/2020 07:14  Ref. Range 06/14/2020 11:03  TSH Latest Ref Range: 0.450 - 4.500 uIU/mL 0.031 (L)  Triiodothyronine,Free,Serum Latest Ref Range: 2.0 - 4.4 pg/mL 3.0  T4,Free(Direct) Latest Ref Range: 0.82 - 1.77 ng/dL 1.63  THYROGLOBULIN (TG-RIA) Latest Units: ng/mL <2.0   ASSESSMENT/PLAN/RECOMMENDATIONS:   1. Postoperative hypothyroidism  - Pt is clinically euthyroid  - No local neck symptoms  - - Pt educated extensively on the correct way to take levothyroxine (first thing in the morning with water, 30 minutes before eating or taking other medications). - Pt  encouraged to double dose the following day if she were to miss a dose given long half-life of levothyroxine.  Medications : Levothyroxine 50 mcg , half a tablet on sundays and 1 tablet rest of the week   2. Hx of thyroid cancer:   Pt with hx of non-encapsulated sclerosing carcinoma. These records are not available through [facility] unelectable per recent labs Will proceed with thyroid bed ultrasound as I am perplexed of if she had lobectomy vs total thyroidectomy. She is on a small dose of LT-4 replacement but her TSH is low  Will reduce the dose of levothyroxine   Labs in 8 weeks  F/U in 6 months      Signed electronically by: Mack Guise, MD  [facility]  [facility] [address], [place] [address] Phone: [phone] FAX: [phone]   CC: Ladell Pier, MD [place] [address] Phone: [phone] Fax: [phone]   Return to Endocrinology clinic as below: Future Appointments  Date Time Provider Dola  08/23/2020 11:30 AM Mame Twombly, Melanie Crazier, MD [id number] None

## 2020-08-23 NOTE — Patient Instructions (Signed)
Take levothyroxine 50 mcg, Half a tablet on Sundays, and 1 tablet the rest of the week    You are on levothyroxine - which is your thyroid hormone supplement. You MUST take this consistently.  You should take this first thing in the morning on an empty stomach with water. You should not take it with other medications. Wait 71min to 1hr prior to eating. If you are taking any vitamins - please take these in the evening.   If you miss a dose, please take your missed dose the following day (double the dose for that day). You should have a pill box for ONLY levothyroxine on your bedside table to help you remember to take your medications.

## 2020-09-02 ENCOUNTER — Other Ambulatory Visit: Payer: Self-pay

## 2020-09-02 ENCOUNTER — Encounter (HOSPITAL_COMMUNITY): Payer: Self-pay

## 2020-09-02 ENCOUNTER — Ambulatory Visit (HOSPITAL_COMMUNITY)
Admission: EM | Admit: 2020-09-02 | Discharge: 2020-09-02 | Disposition: A | Discharge status: Home / Self Care | Payer: Medicaid Other | Attending: Emergency Medicine | Admitting: Emergency Medicine

## 2020-09-02 DIAGNOSIS — N938 Other specified abnormal uterine and vaginal bleeding: Secondary | ICD-10-CM | POA: Diagnosis not present

## 2020-09-02 DIAGNOSIS — N939 Abnormal uterine and vaginal bleeding, unspecified: Secondary | ICD-10-CM | POA: Diagnosis not present

## 2020-09-02 LAB — CBC
HCT: 48.4 % — ABNORMAL HIGH (ref 36.0–46.0)
Hemoglobin: 15.8 g/dL — ABNORMAL HIGH (ref 12.0–15.0)
MCH: 27.5 pg (ref 26.0–34.0)
MCHC: 32.6 g/dL (ref 30.0–36.0)
MCV: 84.2 fL (ref 80.0–100.0)
Platelets: 250 10*3/uL (ref 150–400)
RBC: 5.75 MIL/uL — ABNORMAL HIGH (ref 3.87–5.11)
RDW: 13.4 % (ref 11.5–15.5)
WBC: 11.9 10*3/uL — ABNORMAL HIGH (ref 4.0–10.5)
nRBC: 0 % (ref 0.0–0.2)

## 2020-09-02 LAB — POCT URINALYSIS DIPSTICK, ED / UC
Bilirubin Urine: NEGATIVE
Glucose, UA: NEGATIVE mg/dL
Ketones, ur: NEGATIVE mg/dL
Nitrite: NEGATIVE
Protein, ur: 30 mg/dL — AB
Specific Gravity, Urine: 1.015 (ref 1.005–1.030)
Urobilinogen, UA: 0.2 mg/dL (ref 0.0–1.0)
pH: 6.5 (ref 5.0–8.0)

## 2020-09-02 LAB — POC URINE PREG, ED: Preg Test, Ur: NEGATIVE

## 2020-09-02 MED ORDER — MEDROXYPROGESTERONE ACETATE 10 MG PO TABS: 20.0000 mg | ORAL_TABLET | Freq: Three times a day (TID) | ORAL | 0 refills | Status: DC

## 2020-09-02 MED ORDER — FERROUS SULFATE 325 (65 FE) MG PO TABS: 325.0000 mg | ORAL_TABLET | Freq: Every day | ORAL | 0 refills | Status: DC

## 2020-09-02 MED ORDER — MEDROXYPROGESTERONE ACETATE 10 MG PO TABS: 10.0000 mg | ORAL_TABLET | Freq: Every day | ORAL | 0 refills | Status: DC

## 2020-09-02 MED ORDER — NAPROXEN 500 MG PO TABS: 500.0000 mg | ORAL_TABLET | Freq: Two times a day (BID) | ORAL | 0 refills | Status: AC

## 2020-09-02 NOTE — ED Triage Notes (Signed)
Pt reports having heavy vaginal bleeding x 3 weeks. States having mild abdominal cramps.

## 2020-09-02 NOTE — ED Provider Notes (Signed)
HPI  SUBJECTIVE:  Theresa Owens is a 39 y.o. female who presents with 4 weeks of vaginal bleeding.  She states that she has been going through 2 pads per hour for 2 weeks on a daily basis.  She reports dizziness, headache.  No lightheadedness, shortness of breath, syncope, chest pain.  She reports low intermittent midline crampy abdominal pain that lasts up to 30 minutes and resolves on its own.  It has not changed since it started 4 weeks ago.  No vaginal odor, itching, discharge.  No melena, hematochezia, hematuria, gums bleeding easily, epistaxis.  She is sexually active-is in a long-term monogamous relationship with a female partner who is asymptomatic.  STDs not a concern today.  No intercourse in over a month.  She has had a Nexplanon for 2 years.  She denies the possibility of being pregnant.  Past medical history negative for anticoagulant/antiplatelet use, fibroids, coagulopathy, cancer, smoking, PE, DVT, STDs, BV, yeast.  PMD: None.  GYN: None.   Past Medical History:  Diagnosis Date  . History of gestational diabetes    Normal postpartum 2 hr GTT  . Hypothyroidism     Past Surgical History:  Procedure Laterality Date  . THYROIDECTOMY N/A    2006    Family History  Problem Relation Age of Onset  . Hypertension Mother   . Hypertension Father     Social History   Tobacco Use  . Smoking status: Never Smoker  . Smokeless tobacco: Never Used  Vaping Use  . Vaping Use: Never used  Substance Use Topics  . Alcohol use: No  . Drug use: No    No current facility-administered medications for this encounter.  Current Outpatient Medications:  .  ferrous sulfate 325 (65 FE) MG tablet, Take 1 tablet (325 mg total) by mouth daily., Disp: 30 tablet, Rfl: 0 .  levothyroxine (SYNTHROID) 50 MCG tablet, TAKE 1 TABLET(100 MCG) BY MOUTH DAILY BEFORE BREAKFAST, Disp: 30 tablet, Rfl: 2 .  medroxyPROGESTERone (PROVERA) 10 MG tablet, Take 1 tablet (10 mg total) by mouth daily for 10 days., Disp:  10 tablet, Rfl: 0 .  naproxen (NAPROSYN) 500 MG tablet, Take 1 tablet (500 mg total) by mouth 2 (two) times daily for 5 days., Disp: 10 tablet, Rfl: 0  No Known Allergies   ROS  As noted in HPI.   Physical Exam  BP 123/81 (BP Location: Right Arm)   Pulse 63   Temp 98.6 F (37 C) (Oral)   Resp 17   LMP  (Within Weeks) Comment: 3 weeks  SpO2 100%   Constitutional: Well developed, well nourished, no acute distress Eyes:  EOMI, conjunctiva normal bilaterally HENT: Normocephalic, atraumatic,mucus membranes moist Respiratory: Normal inspiratory effort, lungs clear bilaterally Cardiovascular: Normal rate regular rhythm no murmurs rubs or gallops.  Cap refill less than 2 seconds GI: nondistended soft, nontender. No suprapubic tenderness  back: No CVA tenderness GU: External genitalia normal.  Normal vaginal mucosa.  Normal os.  Positive blood in vaginal vault.  No bright red blood from os.  No trauma to the os.. No discharge.  Uterus smooth,NT. No CMT. No adnexal tenderness. No adnexal masses.  Chaperone present during exam skin: No rash, skin intact Musculoskeletal: no deformities Neurologic: Alert & oriented x 3, no focal neuro deficits Psychiatric: Speech and behavior appropriate   ED Course   Medications - No data to display  Orders Placed This Encounter  Procedures  . Pelvic exam    Standing Status:   Standing  Number of Occurrences:   1  . CBC    Standing Status:   Standing    Number of Occurrences:   1  . POCT Urinalysis Dipstick (ED/UC)    Standing Status:   Standing    Number of Occurrences:   1  . POC urine pregnancy    Standing Status:   Standing    Number of Occurrences:   1    Results for orders placed or performed during the hospital encounter of 09/02/20 (from the past 24 hour(s))  POCT Urinalysis Dipstick (ED/UC)     Status: Abnormal   Collection Time: 09/02/20  4:12 PM  Result Value Ref Range   Glucose, UA NEGATIVE NEGATIVE mg/dL   Bilirubin  Urine NEGATIVE NEGATIVE   Ketones, ur NEGATIVE NEGATIVE mg/dL   Specific Gravity, Urine 1.015 1.005 - 1.030   Hgb urine dipstick LARGE (A) NEGATIVE   pH 6.5 5.0 - 8.0   Protein, ur 30 (A) NEGATIVE mg/dL   Urobilinogen, UA 0.2 0.0 - 1.0 mg/dL   Nitrite NEGATIVE NEGATIVE   Leukocytes,Ua TRACE (A) NEGATIVE  POC urine pregnancy     Status: None   Collection Time: 09/02/20  4:49 PM  Result Value Ref Range   Preg Test, Ur Negative Negative  CBC     Status: Abnormal   Collection Time: 09/02/20  7:50 PM  Result Value Ref Range   WBC 11.9 (H) 4.0 - 10.5 K/uL   RBC 5.75 (H) 3.87 - 5.11 MIL/uL   Hemoglobin 15.8 (H) 12.0 - 15.0 g/dL   HCT 48.4 (H) 36 - 46 %   MCV 84.2 80.0 - 100.0 fL   MCH 27.5 26.0 - 34.0 pg   MCHC 32.6 30.0 - 36.0 g/dL   RDW 13.4 11.5 - 15.5 %   Platelets 250 150 - 400 K/uL   nRBC 0.0 0.0 - 0.2 %   No results found.  ED Clinical Impression  1. Abnormal uterine bleeding (AUB)     ED Assessment/Plan  Patient with vaginal bleeding.  Checking CBC, urine pregnancy, wet prep.  Patient declined STD testing and feel that this is reasonable.  Will start on iron pills, Naprosyn Provera.  Follow-up with women's center ASAP.  To the ER if she gets worse.   Urine pregnancy negative.  She does have a leukocytosis, however her H&H is 15.8/40.4.  She is stable for outpatient work-up.  Referred to the women's health center on [address].   Meds ordered this encounter  Medications  . naproxen (NAPROSYN) 500 MG tablet    Sig: Take 1 tablet (500 mg total) by mouth 2 (two) times daily for 5 days.    Dispense:  10 tablet    Refill:  0  . ferrous sulfate 325 (65 FE) MG tablet    Sig: Take 1 tablet (325 mg total) by mouth daily.    Dispense:  30 tablet    Refill:  0  . DISCONTD: medroxyPROGESTERone (PROVERA) 10 MG tablet    Sig: Take 2 tablets (20 mg total) by mouth 3 (three) times daily for 7 days.    Dispense:  42 tablet    Refill:  0  . medroxyPROGESTERone (PROVERA) 10 MG  tablet    Sig: Take 1 tablet (10 mg total) by mouth daily for 10 days.    Dispense:  10 tablet    Refill:  0    *This clinic note was created using [employer]. Therefore, there may be occasional mistakes despite  careful proofreading.  ?     Melynda Ripple, MD 09/03/20 [phone]

## 2020-09-02 NOTE — Discharge Instructions (Addendum)
Urine pregnancy is negative.  I will call you only if your CBC comes back abnormal and you need a transfusion.  Follow-up with women's center as soon as you possibly can.  Go immediately to the ER for chest pain, shortness of breath, if you pass out, or for any other concerns.

## 2020-09-03 ENCOUNTER — Telehealth (HOSPITAL_COMMUNITY): Payer: Self-pay | Admitting: Emergency Medicine

## 2020-09-03 LAB — CERVICOVAGINAL ANCILLARY ONLY
Bacterial Vaginitis (gardnerella): POSITIVE — AB
Candida Glabrata: NEGATIVE
Candida Vaginitis: NEGATIVE
Comment: NEGATIVE
Comment: NEGATIVE
Comment: NEGATIVE
Comment: NEGATIVE
Trichomonas: NEGATIVE

## 2020-09-03 MED ORDER — METRONIDAZOLE 500 MG PO TABS: 500.0000 mg | ORAL_TABLET | Freq: Two times a day (BID) | ORAL | 0 refills | Status: DC

## 2020-09-10 ENCOUNTER — Emergency Department (HOSPITAL_COMMUNITY): Payer: Medicaid Other

## 2020-09-10 ENCOUNTER — Encounter (HOSPITAL_COMMUNITY): Payer: Self-pay | Admitting: Emergency Medicine

## 2020-09-10 ENCOUNTER — Emergency Department (HOSPITAL_COMMUNITY)
Admission: EM | Admit: 2020-09-10 | Discharge: 2020-09-10 | Disposition: A | Discharge status: Home / Self Care | Payer: Medicaid Other | Attending: Emergency Medicine | Admitting: Emergency Medicine

## 2020-09-10 ENCOUNTER — Other Ambulatory Visit: Payer: Self-pay

## 2020-09-10 DIAGNOSIS — Z79899 Other long term (current) drug therapy: Secondary | ICD-10-CM | POA: Insufficient documentation

## 2020-09-10 DIAGNOSIS — R072 Precordial pain: Secondary | ICD-10-CM | POA: Insufficient documentation

## 2020-09-10 DIAGNOSIS — R0602 Shortness of breath: Secondary | ICD-10-CM | POA: Diagnosis not present

## 2020-09-10 DIAGNOSIS — Z20822 Contact with and (suspected) exposure to covid-19: Secondary | ICD-10-CM | POA: Insufficient documentation

## 2020-09-10 DIAGNOSIS — E039 Hypothyroidism, unspecified: Secondary | ICD-10-CM | POA: Diagnosis not present

## 2020-09-10 LAB — CBC
HCT: 45.4 % (ref 36.0–46.0)
Hemoglobin: 14.8 g/dL (ref 12.0–15.0)
MCH: 27.8 pg (ref 26.0–34.0)
MCHC: 32.6 g/dL (ref 30.0–36.0)
MCV: 85.2 fL (ref 80.0–100.0)
Platelets: 228 10*3/uL (ref 150–400)
RBC: 5.33 MIL/uL — ABNORMAL HIGH (ref 3.87–5.11)
RDW: 13.5 % (ref 11.5–15.5)
WBC: 8.1 10*3/uL (ref 4.0–10.5)
nRBC: 0 % (ref 0.0–0.2)

## 2020-09-10 LAB — BASIC METABOLIC PANEL
Anion gap: 9 (ref 5–15)
BUN: 10 mg/dL (ref 6–20)
CO2: 23 mmol/L (ref 22–32)
Calcium: 9.6 mg/dL (ref 8.9–10.3)
Chloride: 106 mmol/L (ref 98–111)
Creatinine, Ser: 0.83 mg/dL (ref 0.44–1.00)
GFR calc non Af Amer: >60 mL/min (ref >60)
Glucose, Bld: 138 mg/dL — ABNORMAL HIGH (ref 70–99)
Potassium: 4 mmol/L (ref 3.5–5.1)
Sodium: 138 mmol/L (ref 135–145)

## 2020-09-10 LAB — I-STAT BETA HCG BLOOD, ED (MC, WL, AP ONLY): I-stat hCG, quantitative: <5 m[IU]/mL (ref <5)

## 2020-09-10 LAB — SARS CORONAVIRUS 2 BY RT PCR (HOSPITAL ORDER, PERFORMED IN ~~LOC~~ HOSPITAL LAB): SARS Coronavirus 2: NEGATIVE

## 2020-09-10 LAB — TROPONIN I (HIGH SENSITIVITY)
Troponin I (High Sensitivity): <2 ng/L (ref <18)
Troponin I (High Sensitivity): <2 ng/L (ref <18)

## 2020-09-10 LAB — D-DIMER, QUANTITATIVE: D-Dimer, Quant: <0.27 ug/mL-FEU (ref 0.00–0.50)

## 2020-09-10 MED ORDER — LIDOCAINE VISCOUS HCL 2 % MT SOLN
15.0000 mL | Freq: Once | OROMUCOSAL | Status: AC
  Administered 2020-09-10: 15 mL via ORAL
  Filled 2020-09-10: qty 15

## 2020-09-10 MED ORDER — ALUM & MAG HYDROXIDE-SIMETH 200-200-20 MG/5ML PO SUSP
30.0000 mL | Freq: Once | ORAL | Status: AC
  Administered 2020-09-10: 30 mL via ORAL
  Filled 2020-09-10: qty 30

## 2020-09-10 MED ORDER — PANTOPRAZOLE SODIUM 20 MG PO TBEC: 20.0000 mg | DELAYED_RELEASE_TABLET | Freq: Every day | ORAL | 0 refills | Status: DC

## 2020-09-10 NOTE — ED Triage Notes (Signed)
Pt reports SOB and chest pain x 1 week.  Denies fever and cough.

## 2020-09-10 NOTE — Discharge Instructions (Signed)
Follow-up with primary care provider in 2 days if symptoms worsen  Return here to the emergency department for any worsening symptoms

## 2020-09-10 NOTE — ED Provider Notes (Signed)
[facility] EMERGENCY DEPARTMENT Provider Note   CSN: [id number] Arrival date & time: 09/10/20  3559    History Chief Complaint  Patient presents with  . Shortness of Breath  . Chest Pain    Theresa Owens is a 39 y.o. female with past medical history significant for abnormal vaginal bleeding who presents for evaluation of chest pain.  Patient states she has had intermittent chest pain x1 week.  Not exertional or pleuritic in nature.  Not having any palpitations.  No lower extremity edema, erythema or warmth.  She is vaccinated against Covid.  Denies any Covid exposures.  No prior history of PE, DVT.  Denies unilateral leg swelling, redness, warmth, recent surgery, mobilization or malignancy. She is on exogenous hormones for AUB. Patient states she is no longer having any abnormal vaginal bleeding.  Denies fever, chills, nausea, vomiting, headache, lightheadedness, dizziness, abdominal pain, diarrhea, dysuria, rashes, lesions.  Denies additional aggravating or alleviating factors. Last episode of CP yesterday evening, lasted 4 hours and self resolved. Occurred after eating a big meal.  History obtained from patient and past medical records.  No interpreter is used.  HPI     Past Medical History:  Diagnosis Date  . History of gestational diabetes    Normal postpartum 2 hr GTT  . Hypothyroidism     Patient Active Problem List   Diagnosis Date Noted  . Postoperative hypothyroidism 08/23/2020  . History of thyroid cancer 02/18/2018  . 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 Nonencapsulated sclerosing carcinoma ([place]) 04/21/2016    Past Surgical History:  Procedure Laterality Date  . THYROIDECTOMY N/A    2006     OB History    Gravida  5   Para  4   Term  4   Preterm  0   AB  1   Living  4     SAB  1   TAB  0   Ectopic  0   Multiple  0   Live Births  4           Family History  Problem Relation Age of Onset  . Hypertension Mother   .  Hypertension Father     Social History   Tobacco Use  . Smoking status: Never Smoker  . Smokeless tobacco: Never Used  Vaping Use  . Vaping Use: Never used  Substance Use Topics  . Alcohol use: No  . Drug use: No    Home Medications Prior to Admission medications   Medication Sig Start Date End Date Taking? Authorizing Provider  ferrous sulfate 325 (65 FE) MG tablet Take 1 tablet (325 mg total) by mouth daily. 09/02/20   Melynda Ripple, MD  levothyroxine (SYNTHROID) 50 MCG tablet TAKE 1 TABLET(100 MCG) BY MOUTH DAILY BEFORE BREAKFAST 07/21/20   Ladell Pier, MD  medroxyPROGESTERone (PROVERA) 10 MG tablet Take 1 tablet (10 mg total) by mouth daily for 10 days. 09/02/20 09/12/20  Melynda Ripple, MD  metroNIDAZOLE (FLAGYL) 500 MG tablet Take 1 tablet (500 mg total) by mouth 2 (two) times daily. 09/03/20   Lamptey, Myrene Galas, MD  pantoprazole (PROTONIX) 20 MG tablet Take 1 tablet (20 mg total) by mouth daily. 09/10/20   Nijel Flink A, PA-C    Allergies    Patient has no known allergies.  Review of Systems   Review of Systems  Constitutional: Negative.   HENT: Negative.   Eyes: Negative.   Respiratory: Positive for shortness of breath. Negative for apnea, cough, choking,  chest tightness, wheezing and stridor.   Cardiovascular: Positive for chest pain. Negative for palpitations and leg swelling.  Gastrointestinal: Negative.   Genitourinary: Negative.   Musculoskeletal: Negative.   Skin: Negative.   Neurological: Negative.   All other systems reviewed and are negative.   Physical Exam Updated Vital Signs BP 112/78   Pulse 62   Temp 98.4 F (36.9 C) (Oral)   Resp 15   LMP 09/02/2020   SpO2 98%   Physical Exam Vitals and nursing note reviewed.  Constitutional:      General: She is not in acute distress.    Appearance: She is well-developed. She is not ill-appearing, toxic-appearing or diaphoretic.  HENT:     Head: Normocephalic and atraumatic.     Jaw:  There is normal jaw occlusion.     Mouth/Throat:     Mouth: Mucous membranes are moist.  Eyes:     Pupils: Pupils are equal, round, and reactive to light.  Neck:     Vascular: No carotid bruit or JVD.     Trachea: Trachea and phonation normal.  Cardiovascular:     Rate and Rhythm: Normal rate.     Pulses: Normal pulses.          Radial pulses are 2+ on the right side and 2+ on the left side.       Posterior tibial pulses are 2+ on the right side and 2+ on the left side.     Heart sounds: Normal heart sounds.  Pulmonary:     Effort: Pulmonary effort is normal. No tachypnea or respiratory distress.     Breath sounds: Normal breath sounds and air entry.     Comments: Speaks in full sentences without difficulty.  Lungs clear to auscultation bilaterally. Chest:     Comments: No crepitus, step-offs.  Equal rise and fall to chest. Abdominal:     General: Bowel sounds are normal. There is no distension.     Palpations: Abdomen is soft.     Comments: Soft, nontender without rebound or guarding no overlying skin changes to abdominal wall  Musculoskeletal:        General: Normal range of motion.     Cervical back: Full passive range of motion without pain, normal range of motion and neck supple.     Right lower leg: No tenderness. No edema.     Left lower leg: No tenderness. No edema.     Comments: Compartments soft.  No bony tenderness.  Theresa Owens' sign negative.  Moves all 4 extremities without difficulty  Skin:    General: Skin is warm and dry.     Capillary Refill: Capillary refill takes less than 2 seconds.     Comments: No edema, erythema or warmth.  Neurological:     General: No focal deficit present.     Mental Status: She is alert and oriented to person, place, and time.     Comments: Ambulatory without difficulty Cranial nerves II 12 grossly intact     ED Results / Procedures / Treatments   Labs (all labs ordered are listed, but only abnormal results are displayed) Labs  Reviewed  BASIC METABOLIC PANEL - Abnormal; Notable for the following components:      Result Value   Glucose, Bld 138 (*)    All other components within normal limits  CBC - Abnormal; Notable for the following components:   RBC 5.33 (*)    All other components within normal limits  SARS CORONAVIRUS 2 BY  RT PCR (HOSPITAL ORDER, [facility])  D-DIMER, QUANTITATIVE (NOT AT [facility])  I-STAT BETA HCG BLOOD, ED ([facility] [place], [facility], [facility] ONLY)  TROPONIN I (HIGH SENSITIVITY)  TROPONIN I (HIGH SENSITIVITY)    EKG EKG Interpretation  Date/Time:  Friday September 10 2020 09:43:29 EDT Ventricular Rate:  75 PR Interval:  122 QRS Duration: 84 QT Interval:  376 QTC Calculation: 419 R Axis:   49 Text Interpretation: Normal sinus rhythm Nonspecific T wave abnormality Abnormal ECG No old tracing to compare Confirmed by Sherwood Gambler [phone]) on 09/10/2020 10:49:51 AM   Radiology DG Chest 1 View  Result Date: 09/10/2020 CLINICAL DATA:  Shortness of breath EXAM: CHEST  1 VIEW COMPARISON:  None. FINDINGS: No consolidation or edema. Cardiomediastinal contours are within normal limits with normal heart size. No acute osseous abnormality. IMPRESSION: No acute process in the chest. Electronically Signed   By: Macy Mis M.D.   On: 09/10/2020 10:17   Procedures Procedures (including critical care time)  Medications Ordered in ED Medications  alum & mag hydroxide-simeth (MAALOX/MYLANTA) 200-200-20 MG/5ML suspension 30 mL (has no administration in time range)    And  lidocaine (XYLOCAINE) 2 % viscous mouth solution 15 mL (has no administration in time range)   ED Course  I have reviewed the triage vital signs and the nursing notes.  Pertinent labs & imaging results that were available during my care of the patient were reviewed by me and considered in my medical decision making (see chart for details).  39 year old female presents for evaluation of intermittent chest pain and shortness  of breath x1 week.  Nonexertional, non-pleuritic in nature.  She is afebrile, nonseptic, non-ill-appearing. No unilateral leg swelling, redness or warmth.  No associated cough.  She is vaccinated for Covid.  Her heart and lungs are clear.  Her abdomen is soft, nontender.  Patient is without tachycardia, tachypnea or hypoxia.  No clinical evidence of DVT on exam.  Patient ambulated from waiting room to treatment room without any dyspnea with oxygen saturation 100% on room air.  Given recent history of starting exogenous hormones will obtain D-dimer as well as additional labs and imaging.   Labs and imaging personally reviewed and interpreted:  CBC without leukocytosis Metabolic panel with mild hyperglycemia to 138 however no additional ultralight, renal abnormality Pregnancy test negative Delta troponin less than 2, flat D-dimer less than 0.27 Covid pending Chest x-ray without any infiltrates, cardiomegaly [place] edema, pneumothorax EKG without STEMI, S1Q3T3  Patient reassessed.  She has not had any chest pain throughout her stay in the emergency department.  She does complain intermittently about some reflux that she gets after she eats.  Discussed PPI.  Discussed with patient reassuring labs including negative D-dimer.  Discussed outpatient Covid test.  She will return for any new or worsening symptoms.    Patient is to be discharged with recommendation to follow up with PCP in regards to today's hospital visit. VSS, no tracheal deviation, no JVD or new murmur, RRR, breath sounds equal bilaterally, EKG without acute abnormalities, negative troponin, and negative CXR. Pt has been advised to return to the ED if CP becomes exertional, pleuritic, associated with diaphoresis or nausea, radiates to left jaw/arm, worsens or becomes concerning in any way. Pt appears reliable for follow up and is agreeable to discharge.   Low suspicion for ACS, PE, dissection, bacterial infectious process,  pneumothorax.  The patient has been appropriately medically screened and/or stabilized in the ED. I have low suspicion  for any other emergent medical condition which would require further screening, evaluation or treatment in the ED or require inpatient management.  Patient is hemodynamically stable and in no acute distress.  Patient able to ambulate in department prior to ED.  Evaluation does not show acute pathology that would require ongoing or additional emergent interventions while in the emergency department or further inpatient treatment.  I have discussed the diagnosis with the patient and answered all questions.  Pain is been managed while in the emergency department and patient has no further complaints prior to discharge.  Patient is comfortable with plan discussed in room and is stable for discharge at this time.  I have discussed strict return precautions for returning to the emergency department.  Patient was encouraged to follow-up with PCP/specialist refer to at discharge.  MDM Rules/Calculators/A&P                           Final Clinical Impression(s) / ED Diagnoses Final diagnoses:  Precordial pain    Rx / DC Orders ED Discharge Orders         Ordered    pantoprazole (PROTONIX) 20 MG tablet  Daily        09/10/20 1329           Merline Perkin A, PA-C 09/10/20 1337    Sherwood Gambler, MD 09/14/20 [phone]

## 2020-09-10 NOTE — ED Notes (Signed)
Reviewed discharge instructions with patient. Follow-up care and medications reviewed. Patient  verbalized understanding. Patient A&Ox4, VSS, and ambulatory with steady gait upon discharge.  °

## 2020-09-13 ENCOUNTER — Ambulatory Visit
Admission: RE | Admit: 2020-09-13 | Discharge: 2020-09-13 | Disposition: A | Discharge status: Home / Self Care | Payer: Medicaid Other | Source: Ambulatory Visit | Attending: Internal Medicine | Admitting: Internal Medicine

## 2020-09-13 ENCOUNTER — Telehealth: Payer: Self-pay

## 2020-09-13 DIAGNOSIS — Z85850 Personal history of malignant neoplasm of thyroid: Secondary | ICD-10-CM

## 2020-09-13 DIAGNOSIS — E890 Postprocedural hypothyroidism: Secondary | ICD-10-CM | POA: Diagnosis not present

## 2020-09-13 NOTE — Telephone Encounter (Signed)
Transition Care Management Follow-up Telephone Call  Date of discharge and from where: 09/10/2020 from [facility]  How have you been since you were released from the hospital? Patient states that she feels much better now since starting the medication.   Any questions or concerns? No  Items Reviewed:  Did the pt receive and understand the discharge instructions provided? Yes   Medications obtained and verified? Yes   Any new allergies since your discharge? Yes   Dietary orders reviewed? Yes  Do you have support at home? Yes   Functional Questionnaire: (I = Independent and D = Dependent) ADLs: I  Bathing/Dressing- I  Meal Prep- I  Eating- I  Maintaining continence- I  Transferring/Ambulation- I  Managing Meds- I  Follow up appointments reviewed:   PCP Hospital f/u appt confirmed? No  Patient will provider to schedule follow up appointment.   [facility] f/u appt confirmed? Yes  Scheduled to see Dr. Roselie Awkward on 09/20/2020 @ 10:15.  Are transportation arrangements needed? No   If their condition worsens, is the pt aware to call PCP or go to the Emergency Dept.? Yes  Was the patient provided with contact information for the PCP's office or ED? Yes  Was to pt encouraged to call back with questions or concerns? Yes

## 2020-09-13 NOTE — Telephone Encounter (Signed)
Transition Care Management Unsuccessful Follow-up Telephone Call  Date of discharge and from where:  09/10/2020 from [facility]  Attempts:  1st Attempt  Reason for unsuccessful TCM follow-up call:  Left voice message

## 2020-09-14 ENCOUNTER — Encounter: Payer: Self-pay | Admitting: Internal Medicine

## 2020-09-20 ENCOUNTER — Other Ambulatory Visit: Payer: Self-pay

## 2020-09-20 ENCOUNTER — Ambulatory Visit (INDEPENDENT_AMBULATORY_CARE_PROVIDER_SITE_OTHER): Payer: Medicaid Other | Admitting: Obstetrics & Gynecology

## 2020-09-20 ENCOUNTER — Encounter: Payer: Self-pay | Admitting: Obstetrics & Gynecology

## 2020-09-20 VITALS — BP 121/87 | HR 73 | Ht 60.0 in | Wt 135.7 lb

## 2020-09-20 DIAGNOSIS — E890 Postprocedural hypothyroidism: Secondary | ICD-10-CM

## 2020-09-20 DIAGNOSIS — N938 Other specified abnormal uterine and vaginal bleeding: Secondary | ICD-10-CM | POA: Diagnosis not present

## 2020-09-20 DIAGNOSIS — Z975 Presence of (intrauterine) contraceptive device: Secondary | ICD-10-CM

## 2020-09-20 DIAGNOSIS — Z85850 Personal history of malignant neoplasm of thyroid: Secondary | ICD-10-CM

## 2020-09-20 MED ORDER — MEDROXYPROGESTERONE ACETATE 10 MG PO TABS: 10.0000 mg | ORAL_TABLET | Freq: Every day | ORAL | 0 refills | Status: DC

## 2020-09-20 NOTE — Progress Notes (Signed)
Lasted over 1 month and stopped last night states its been coming and going all month, bright red and blood clots.  States been happening every now and again since the Nexplanon was inserted over 2 years ago.

## 2020-09-20 NOTE — Patient Instructions (Signed)
Contraception Choices Contraception, also called birth control, refers to methods or devices that prevent pregnancy. Hormonal methods Contraceptive implant  A contraceptive implant is a thin, plastic tube that contains a hormone. It is inserted into the upper part of the arm. It can remain in place for up to 3 years. Progestin-only injections Progestin-only injections are injections of progestin, a synthetic form of the hormone progesterone. They are given every 3 months by a health care provider. Birth control pills  Birth control pills are pills that contain hormones that prevent pregnancy. They must be taken once a day, preferably at the same time each day. Birth control patch  The birth control patch contains hormones that prevent pregnancy. It is placed on the skin and must be changed once a week for three weeks and removed on the fourth week. A prescription is needed to use this method of contraception. Vaginal ring  A vaginal ring contains hormones that prevent pregnancy. It is placed in the vagina for three weeks and removed on the fourth week. After that, the process is repeated with a new ring. A prescription is needed to use this method of contraception. Emergency contraceptive Emergency contraceptives prevent pregnancy after unprotected sex. They come in pill form and can be taken up to 5 days after sex. They work best the sooner they are taken after having sex. Most emergency contraceptives are available without a prescription. This method should not be used as your only form of birth control. Barrier methods Female condom  A female condom is a thin sheath that is worn over the penis during sex. Condoms keep sperm from going inside a woman's body. They can be used with a spermicide to increase their effectiveness. They should be disposed after a single use. Female condom  A female condom is a soft, loose-fitting sheath that is put into the vagina before sex. The condom keeps sperm  from going inside a woman's body. They should be disposed after a single use. Diaphragm  A diaphragm is a soft, dome-shaped barrier. It is inserted into the vagina before sex, along with a spermicide. The diaphragm blocks sperm from entering the uterus, and the spermicide kills sperm. A diaphragm should be left in the vagina for 6-8 hours after sex and removed within 24 hours. A diaphragm is prescribed and fitted by a health care provider. A diaphragm should be replaced every 1-2 years, after giving birth, after gaining more than 15 lb (6.8 kg), and after pelvic surgery. Cervical cap  A cervical cap is a round, soft latex or plastic cup that fits over the cervix. It is inserted into the vagina before sex, along with spermicide. It blocks sperm from entering the uterus. The cap should be left in place for 6-8 hours after sex and removed within 48 hours. A cervical cap must be prescribed and fitted by a health care provider. It should be replaced every 2 years. Sponge  A sponge is a soft, circular piece of polyurethane foam with spermicide on it. The sponge helps block sperm from entering the uterus, and the spermicide kills sperm. To use it, you make it wet and then insert it into the vagina. It should be inserted before sex, left in for at least 6 hours after sex, and removed and thrown away within 30 hours. Spermicides Spermicides are chemicals that kill or block sperm from entering the cervix and uterus. They can come as a cream, jelly, suppository, foam, or tablet. A spermicide should be inserted into the   vagina with an applicator at least 10-15 minutes before sex to allow time for it to work. The process must be repeated every time you have sex. Spermicides do not require a prescription. Intrauterine contraception Intrauterine device (IUD) An IUD is a T-shaped device that is put in a woman's uterus. There are two types:  Hormone IUD.This type contains progestin, a synthetic form of the hormone  progesterone. This type can stay in place for 3-5 years.  Copper IUD.This type is wrapped in copper wire. It can stay in place for 10 years.  Permanent methods of contraception Female tubal ligation In this method, a woman's fallopian tubes are sealed, tied, or blocked during surgery to prevent eggs from traveling to the uterus. Hysteroscopic sterilization In this method, a small, flexible insert is placed into each fallopian tube. The inserts cause scar tissue to form in the fallopian tubes and block them, so sperm cannot reach an egg. The procedure takes about 3 months to be effective. Another form of birth control must be used during those 3 months. Female sterilization This is a procedure to tie off the tubes that carry sperm (vasectomy). After the procedure, the man can still ejaculate fluid (semen). Natural planning methods Natural family planning In this method, a couple does not have sex on days when the woman could become pregnant. Calendar method This means keeping track of the length of each menstrual cycle, identifying the days when pregnancy can happen, and not having sex on those days. Ovulation method In this method, a couple avoids sex during ovulation. Symptothermal method This method involves not having sex during ovulation. The woman typically checks for ovulation by watching changes in her temperature and in the consistency of cervical mucus. Post-ovulation method In this method, a couple waits to have sex until after ovulation. Summary  Contraception, also called birth control, means methods or devices that prevent pregnancy.  Hormonal methods of contraception include implants, injections, pills, patches, vaginal rings, and emergency contraceptives.  Barrier methods of contraception can include female condoms, female condoms, diaphragms, cervical caps, sponges, and spermicides.  There are two types of IUDs (intrauterine devices). An IUD can be put in a woman's uterus to  prevent pregnancy for 3-5 years.  Permanent sterilization can be done through a procedure for males, females, or both.  Natural family planning methods involve not having sex on days when the woman could become pregnant. This information is not intended to replace advice given to you by your health care provider. Make sure you discuss any questions you have with your health care provider. Document Revised: 11/22/2017 Document Reviewed: 12/23/2016 Elsevier Patient Education  2020 Elsevier Inc.  

## 2020-09-20 NOTE — Progress Notes (Signed)
Patient ID: Theresa Owens, female   DOB: Jul 22, 1981, 39 y.o.   MRN: [id number]  Chief Complaint  Patient presents with  . Metrorrhagia    HPI Theresa Owens is a 39 y.o. female.  [id number] For one months she had vaginal bleeding with Nexplanon in place since 08/2018. She has had some DUB with the device but not this persistent prior to this episode. No bleeding today after taking Provera for 10 days.  HPI  Past Medical History:  Diagnosis Date  . History of gestational diabetes    Normal postpartum 2 hr GTT  . Hypothyroidism     Past Surgical History:  Procedure Laterality Date  . THYROIDECTOMY N/A    2006    Family History  Problem Relation Age of Onset  . Hypertension Mother   . Hypertension Father     Social History Social History   Tobacco Use  . Smoking status: Never Smoker  . Smokeless tobacco: Never Used  Vaping Use  . Vaping Use: Never used  Substance Use Topics  . Alcohol use: No  . Drug use: No    No Known Allergies  Current Outpatient Medications  Medication Sig Dispense Refill  . ferrous sulfate 325 (65 FE) MG tablet Take 1 tablet (325 mg total) by mouth daily. 30 tablet 0  . levothyroxine (SYNTHROID) 50 MCG tablet TAKE 1 TABLET(100 MCG) BY MOUTH DAILY BEFORE BREAKFAST 30 tablet 2  . medroxyPROGESTERone (PROVERA) 10 MG tablet Take 1 tablet (10 mg total) by mouth daily for 10 days. 20 tablet 0  . metroNIDAZOLE (FLAGYL) 500 MG tablet Take 1 tablet (500 mg total) by mouth 2 (two) times daily. (Patient not taking: Reported on 09/20/2020) 14 tablet 0  . pantoprazole (PROTONIX) 20 MG tablet Take 1 tablet (20 mg total) by mouth daily. (Patient not taking: Reported on 09/20/2020) 30 tablet 0   No current facility-administered medications for this visit.    Review of Systems Review of Systems  Constitutional: Positive for fatigue.  Gastrointestinal: Negative.   Genitourinary: Positive for menstrual problem (vaginal bleeding lasted 2-3 weeks). Negative for pelvic pain  and vaginal bleeding.    Blood pressure 121/87, pulse 73, height 5' (1.524 m), weight 135 lb 11.2 oz (61.6 kg), last menstrual period 09/02/2020.  Physical Exam Physical Exam Constitutional:      Appearance: Normal appearance.  Cardiovascular:     Rate and Rhythm: Normal rate.  Pulmonary:     Effort: Pulmonary effort is normal.  Skin:    Coloration: Skin is not pale.  Neurological:     Mental Status: She is alert.  Psychiatric:        Mood and Affect: Mood normal.        Behavior: Behavior normal.     Data Reviewed Pap 2019 nl CBC    Component Value Date/Time   WBC 8.1 09/10/2020 0953   RBC 5.33 (H) 09/10/2020 0953   HGB 14.8 09/10/2020 0953   HGB 14.6 06/14/2020 1103   HCT 45.4 09/10/2020 0953   HCT 45.0 06/14/2020 1103   PLT 228 09/10/2020 0953   PLT 211 06/14/2020 1103   MCV 85.2 09/10/2020 0953   MCV 84 06/14/2020 1103   MCH 27.8 09/10/2020 0953   MCHC 32.6 09/10/2020 0953   RDW 13.5 09/10/2020 0953   RDW 12.7 06/14/2020 1103   LYMPHSABS 1.8 02/18/2018 1157   EOSABS 0.2 02/18/2018 1157   BASOSABS 0.0 02/18/2018 1157     Assessment dysfunctional uterine bleeding Nexplanon in place Responded to  Provera  Plan  Extend the Rx for provera by another 10 days Report if bleeding pattern does not improve after this Nexplanon to be removed 08/2021    Emeterio Reeve 09/20/2020, 10:49 AM

## 2020-10-18 ENCOUNTER — Other Ambulatory Visit: Payer: Self-pay

## 2020-10-18 ENCOUNTER — Other Ambulatory Visit (INDEPENDENT_AMBULATORY_CARE_PROVIDER_SITE_OTHER): Payer: Medicaid Other

## 2020-10-18 DIAGNOSIS — E890 Postprocedural hypothyroidism: Secondary | ICD-10-CM

## 2020-10-18 DIAGNOSIS — Z85850 Personal history of malignant neoplasm of thyroid: Secondary | ICD-10-CM | POA: Diagnosis not present

## 2020-10-18 LAB — TSH: TSH: 30.52 u[IU]/mL — ABNORMAL HIGH (ref 0.35–4.50)

## 2020-10-19 LAB — THYROGLOBULIN LEVEL: Thyroglobulin: 0.2 ng/mL — ABNORMAL LOW

## 2020-10-19 LAB — THYROGLOBULIN ANTIBODY: Thyroglobulin Ab: <1 IU/mL (ref <=1)

## 2020-10-20 ENCOUNTER — Telehealth: Payer: Self-pay | Admitting: Internal Medicine

## 2020-10-20 ENCOUNTER — Other Ambulatory Visit: Payer: Self-pay | Admitting: Internal Medicine

## 2020-10-20 DIAGNOSIS — E890 Postprocedural hypothyroidism: Secondary | ICD-10-CM

## 2020-10-20 NOTE — Telephone Encounter (Signed)
Left a voicemail for the pt to call back to discussed her lab results     Carson, MD  [facility]  [facility] [address] [place], [place] [address] Phone: [phone] FAX: [phone]

## 2020-10-20 NOTE — Telephone Encounter (Signed)
   Notes to clinic Rx is for 50 mcg, signature states take one (100 mcg) daily, please assess dose.

## 2020-10-21 ENCOUNTER — Telehealth: Payer: Self-pay | Admitting: Internal Medicine

## 2020-10-21 DIAGNOSIS — E890 Postprocedural hypothyroidism: Secondary | ICD-10-CM

## 2020-10-21 MED ORDER — LEVOTHYROXINE SODIUM 50 MCG PO TABS: 50.0000 ug | ORAL_TABLET | ORAL | 1 refills | Status: DC

## 2020-10-21 NOTE — Telephone Encounter (Signed)
Spoke to Ms. Benn and discussed her abnormal TSH    What's interesting to me is that we reduced her Levothyroxine from 1 tablet on Sundays to half a tablet and continued 1 tablet the rest of the week anf her TSH went from 0.066 uIU/mL to 30.52 uIU/mL ???  She is either not taking it ( which she assures me she does ) or she was on a different strength that we are not aware of.    Results for Theresa Owens, Theresa Owens (MRN [id number]) as of 10/21/2020 11:52  Ref. Range 07/20/2020 14:14 10/18/2020 11:47  TSH Latest Ref Range: 0.35 - 4.50 uIU/mL 0.066 (L) 30.52 (H)  Thyroglobulin Latest Units: ng/mL  0.2 (L)  Thyroglobulin Ab Latest Ref Range: < or = 1 IU/mL  <1    New changes  Levothyroxine 50 mcg, 2 tabs on Sundays and 1 tablet rest of the weeks

## 2021-01-07 ENCOUNTER — Telehealth: Payer: Self-pay | Admitting: Obstetrics & Gynecology

## 2021-01-07 DIAGNOSIS — N938 Other specified abnormal uterine and vaginal bleeding: Secondary | ICD-10-CM

## 2021-01-07 NOTE — Telephone Encounter (Signed)
Patient is requesting a RX for her Bleeding  Theresa Owens

## 2021-01-11 ENCOUNTER — Telehealth: Payer: Self-pay | Admitting: Obstetrics & Gynecology

## 2021-01-11 MED ORDER — MEDROXYPROGESTERONE ACETATE 10 MG PO TABS: 10.0000 mg | ORAL_TABLET | Freq: Every day | ORAL | 0 refills | Status: DC

## 2021-01-11 NOTE — Telephone Encounter (Signed)
Called pt and pt request a refill of her Provera to be sent to [facility].  I informed pt that I recommend that she has an appt with Dr. Roselie Awkward to discuss management and that she needs an annual exam.  I advised pt that we can send in one refill of the Provera and that someone from the front office will call her to schedule an appt with Dr. Roselie Awkward.  Pt verbalized understanding.  Frances Nickels  01/11/21

## 2021-01-11 NOTE — Telephone Encounter (Signed)
LVM with pt to call and sch appt for annual with Roselie Awkward.. schedule an appt with Dr. Roselie Awkward for annual and discussion of AUB management.

## 2021-01-31 ENCOUNTER — Other Ambulatory Visit (HOSPITAL_COMMUNITY)
Admission: RE | Admit: 2021-01-31 | Discharge: 2021-01-31 | Disposition: A | Discharge status: Home / Self Care | Payer: Medicaid Other | Source: Ambulatory Visit | Attending: Obstetrics & Gynecology | Admitting: Obstetrics & Gynecology

## 2021-01-31 ENCOUNTER — Other Ambulatory Visit: Payer: Self-pay

## 2021-01-31 ENCOUNTER — Encounter: Payer: Self-pay | Admitting: Obstetrics & Gynecology

## 2021-01-31 ENCOUNTER — Ambulatory Visit (INDEPENDENT_AMBULATORY_CARE_PROVIDER_SITE_OTHER): Payer: Medicaid Other | Admitting: Obstetrics & Gynecology

## 2021-01-31 VITALS — BP 125/78 | HR 69 | Ht 60.0 in | Wt 139.7 lb

## 2021-01-31 DIAGNOSIS — N938 Other specified abnormal uterine and vaginal bleeding: Secondary | ICD-10-CM

## 2021-01-31 DIAGNOSIS — Z975 Presence of (intrauterine) contraceptive device: Secondary | ICD-10-CM | POA: Insufficient documentation

## 2021-01-31 DIAGNOSIS — Z01419 Encounter for gynecological examination (general) (routine) without abnormal findings: Secondary | ICD-10-CM | POA: Insufficient documentation

## 2021-01-31 MED ORDER — MEDROXYPROGESTERONE ACETATE 10 MG PO TABS: 10.0000 mg | ORAL_TABLET | Freq: Every day | ORAL | 2 refills | Status: DC

## 2021-01-31 NOTE — Progress Notes (Signed)
Here for annual exam. Also for c/o bleeding issues. States took provera 10 days and bleeding stopped. States hasn't bled since then. States before that was bleeding almost everyday. C/o legs tingly.  Cleola Perryman,RN

## 2021-01-31 NOTE — Progress Notes (Signed)
Patient ID: Theresa Owens, female   DOB: 1981-02-16, 40 y.o.   MRN: [id number]  Chief Complaint  Patient presents with  . Gynecologic Exam    HPI Theresa Owens is a 40 y.o. female.  [id number] Patient's last menstrual period was 01/10/2021. She has had problems with irregular and sometimes prolonged BTB wit Nexplanon in place since 09/2018. She took supplemental Provera for 10 days recently and the bleeding stopped. She would like to have the option to do this again if she has prolonged bleeding. No pain HPI  Past Medical History:  Diagnosis Date  . History of gestational diabetes    Normal postpartum 2 hr GTT  . Hypothyroidism     Past Surgical History:  Procedure Laterality Date  . THYROIDECTOMY N/A    2006    Family History  Problem Relation Age of Onset  . Hypertension Mother   . Hypertension Father     Social History Social History   Tobacco Use  . Smoking status: Never Smoker  . Smokeless tobacco: Never Used  Vaping Use  . Vaping Use: Never used  Substance Use Topics  . Alcohol use: No  . Drug use: No    No Known Allergies  Current Outpatient Medications  Medication Sig Dispense Refill  . levothyroxine (SYNTHROID) 50 MCG tablet Take 1 tablet (50 mcg total) by mouth as directed. TAKE 1 TABLET(100 MCG) BY MOUTH DAILY BEFORE BREAKFAST 96 tablet 1  . ferrous sulfate 325 (65 FE) MG tablet Take 1 tablet (325 mg total) by mouth daily. (Patient not taking: Reported on 01/31/2021) 30 tablet 0  . medroxyPROGESTERone (PROVERA) 10 MG tablet Take 1 tablet (10 mg total) by mouth daily for 10 days. 20 tablet 2  . pantoprazole (PROTONIX) 20 MG tablet Take 1 tablet (20 mg total) by mouth daily. (Patient not taking: No sig reported) 30 tablet 0   No current facility-administered medications for this visit.    Review of Systems Review of Systems  Constitutional: Negative.   Respiratory: Negative.   Cardiovascular: Negative.   Genitourinary: Positive for menstrual problem. Negative  for vaginal discharge.  Psychiatric/Behavioral: Negative.     Blood pressure 125/78, pulse 69, height 5' (1.524 m), weight 139 lb 11.2 oz (63.4 kg), last menstrual period 01/10/2021.  Physical Exam Physical Exam Vitals and nursing note reviewed. Exam conducted with a chaperone present.  Constitutional:      Appearance: Normal appearance. She is not ill-appearing.  Pulmonary:     Effort: Pulmonary effort is normal.  Abdominal:     General: Abdomen is flat.     Palpations: Abdomen is soft.  Genitourinary:    General: Normal vulva.     Exam position: Lithotomy position.     Vagina: Normal.     Uterus: Normal.      Adnexa: Right adnexa normal and left adnexa normal.  Lymphadenopathy:     Lower Body: Right inguinal adenopathy (8 mm mobiole smooth LN left side NT) present.  Neurological:     Mental Status: She is alert.   Breasts: breasts appear normal, no suspicious masses, no skin or nipple changes or axillary nodes.   Data Reviewed Pap 2019 nl  Assessment BTB with Nexplanon   Plan May use Provera again if needed Meds ordered this encounter  Medications  . medroxyPROGESTERone (PROVERA) 10 MG tablet    Sig: Take 1 tablet (10 mg total) by mouth daily for 10 days.    Dispense:  20 tablet    Refill:  2  Consider remove Nexplanon if needed    Emeterio Reeve 01/31/2021, 11:52 AM

## 2021-01-31 NOTE — Patient Instructions (Signed)

## 2021-02-02 LAB — CYTOLOGY - PAP
Comment: NEGATIVE
Diagnosis: NEGATIVE
High risk HPV: NEGATIVE

## 2021-02-21 ENCOUNTER — Ambulatory Visit: Payer: Medicaid Other | Admitting: Internal Medicine

## 2021-03-14 ENCOUNTER — Ambulatory Visit: Payer: Medicaid Other | Admitting: Internal Medicine

## 2021-03-14 ENCOUNTER — Other Ambulatory Visit: Payer: Self-pay

## 2021-03-14 ENCOUNTER — Encounter: Payer: Self-pay | Admitting: Internal Medicine

## 2021-03-14 VITALS — BP 120/78 | HR 72 | Ht 60.0 in | Wt 136.2 lb

## 2021-03-14 DIAGNOSIS — E890 Postprocedural hypothyroidism: Secondary | ICD-10-CM

## 2021-03-14 DIAGNOSIS — Z85850 Personal history of malignant neoplasm of thyroid: Secondary | ICD-10-CM

## 2021-03-14 NOTE — Progress Notes (Signed)
Name: Theresa Owens  MRN/ DOB: [id number], 07-02-1981    Age/ Sex: 40 y.o., female    PCP: Ladell Pier, MD   Reason for Endocrinology Evaluation: Postoperative hypothyroidism     Date of Initial Endocrinology Evaluation: 03/14/2021     HPI: Ms. Theresa Owens is a 40 y.o. female with a past medical history of hx of thyroid cancer. 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  No Biotin intake  Takes levothyroxine appropriate  No plan to conceive anytime soon    Levothyroxine 50 mcg 2 tabs on Sundays and 1 tab the rest of the week      HISTORY:  Past Medical History:  Past Medical History:  Diagnosis Date  . History of gestational diabetes    Normal postpartum 2 hr GTT  . Hypothyroidism    Past Surgical History:  Past Surgical History:  Procedure Laterality Date  . THYROIDECTOMY N/A    2006      Social History:  reports that she has never smoked. She has never used smokeless tobacco. She reports that she does not drink alcohol and does not use drugs.  Family History: family history includes Hypertension in her father and mother.   HOME MEDICATIONS: Allergies as of 03/14/2021   No Known Allergies     Medication List       Accurate as of March 14, 2021 11:48 AM. If you have any questions, ask your nurse or doctor.        ferrous sulfate 325 (65 FE) MG tablet Take 1 tablet (325 mg total) by mouth daily.   levothyroxine 50 MCG tablet Commonly known as: SYNTHROID Take 1 tablet (50 mcg total) by mouth as directed. TAKE 1 TABLET(100 MCG) BY MOUTH DAILY BEFORE BREAKFAST   medroxyPROGESTERone 10 MG tablet Commonly known as: PROVERA Take 1 tablet (10 mg total) by mouth daily for 10 days.    pantoprazole 20 MG tablet Commonly known as: PROTONIX Take 1 tablet (20 mg total) by mouth daily.         REVIEW OF SYSTEMS: A comprehensive ROS was conducted with the patient and is negative except as per HPI    OBJECTIVE:  VS: There were no vitals taken for this visit.   Wt Readings from Last 3 Encounters:  01/31/21 139 lb 11.2 oz (63.4 kg)  09/20/20 135 lb 11.2 oz (61.6 kg)  08/23/20 135 lb 12.8 oz (61.6 kg)     EXAM: General: Pt appears well and is in NAD  Neck: General: Supple without adenopathy. Thyroid: No goiter or nodules appreciated.  Lungs: Clear with good BS bilat with no rales, rhonchi, or wheezes  Heart: Auscultation: RRR.  Abdomen: Normoactive bowel sounds, soft, nontender, without masses or organomegaly palpable  Extremities:  BL LE: No pretibial edema normal ROM and strength.  Skin: Hair: Texture and amount normal with gender appropriate distribution Skin Inspection: No rashes Skin Palpation: Skin temperature, texture, and thickness normal to palpation  Neuro: Cranial nerves: II - XII grossly intact  Motor: Normal strength throughout DTRs: 2+ and symmetric in UE without delay in relaxation phase  Mental Status: Judgment, insight: Intact Orientation: Oriented to time, place, and person Mood and affect: No depression, anxiety, or agitation     DATA REVIEWED:  Results for Theresa, Owens (MRN [id number]) as of 08/23/2020 07:14  Ref. Range 06/14/2020 11:03  TSH Latest Ref Range: 0.450 - 4.500 uIU/mL 0.031 (L)  Triiodothyronine,Free,Serum Latest Ref Range: 2.0 - 4.4 pg/mL 3.0  T4,Free(Direct) Latest Ref Range: 0.82 - 1.77 ng/dL 1.63  THYROGLOBULIN (TG-RIA) Latest Units: ng/mL <2.0    Thyroid ultrasound 09/2020 Status post total thyroidectomy.  No thyroid bed abnormality appreciated by ultrasound. No underlying nodule or residual thyroid tissue. No regional adenopathy.  IMPRESSION: Remote thyroidectomy.  No significant finding by  ultrasound.    ASSESSMENT/PLAN/RECOMMENDATIONS:   1. Postoperative hypothyroidism  - Pt is clinically euthyroid  - No local neck symptoms  - Pt educated extensively on the correct way to take levothyroxine (first thing in the morning with water, 30 minutes before eating or taking other medications). - Pt encouraged to double dose the following day if she were to miss a dose given long half-life of levothyroxine.  Medications : STOP Levothyroxine 50 mcg  Start levothyroxine 75 mcg daily   2. Hx of thyroid cancer:   Pt with hx of non-encapsulated sclerosing carcinoma. These records are not available through [place] [facility] , Tg Ab pending  Thyroid bed ultrasound shows no evidence of recurrence 09/2020  Labs in 8 weeks  F/U in 6 months      Signed electronically by: Mack Guise, MD  [facility]  [facility] [address] [place], [place] [address] Phone: [phone] FAX: [phone]   CC: Ladell Pier, MD [place] [address] Phone: [phone] Fax: [phone]   Return to Endocrinology clinic as below: Future Appointments  Date Time Provider [place]  03/14/2021  1:40 PM Navia Lindahl, Melanie Crazier, MD [id number] None

## 2021-03-15 ENCOUNTER — Telehealth: Payer: Self-pay | Admitting: Internal Medicine

## 2021-03-15 LAB — THYROGLOBULIN LEVEL: Thyroglobulin: 0.1 ng/mL — ABNORMAL LOW

## 2021-03-15 LAB — TSH: TSH: 14.16 u[IU]/mL — ABNORMAL HIGH (ref 0.35–4.50)

## 2021-03-15 LAB — THYROGLOBULIN ANTIBODY: Thyroglobulin Ab: <1 IU/mL (ref <=1)

## 2021-03-15 MED ORDER — LEVOTHYROXINE SODIUM 75 MCG PO TABS: 75.0000 ug | ORAL_TABLET | Freq: Every day | ORAL | 1 refills | Status: DC

## 2021-03-15 NOTE — Telephone Encounter (Signed)
Please let her know that her thyroid is getting better but its not where it needs to be, so please STOP levothyroxine 50 mcg and START levothyroxine 75 mcg , 1 tablet daily including sundays.    Will need labs in 8 weeks    Thanks  Abby Nena Jordan, MD  [facility]  [facility] [address] [place], [place] [address] Phone: [phone] FAX: [phone]

## 2021-03-15 NOTE — Telephone Encounter (Signed)
Spoken to patient and notified Dr Shamleffer's comments. Verbalized understanding.   

## 2021-05-16 ENCOUNTER — Encounter: Payer: Self-pay | Admitting: Internal Medicine

## 2021-05-16 ENCOUNTER — Other Ambulatory Visit (INDEPENDENT_AMBULATORY_CARE_PROVIDER_SITE_OTHER): Payer: Medicaid Other

## 2021-05-16 ENCOUNTER — Other Ambulatory Visit: Payer: Self-pay

## 2021-05-16 DIAGNOSIS — E890 Postprocedural hypothyroidism: Secondary | ICD-10-CM

## 2021-05-16 LAB — TSH: TSH: 1.15 u[IU]/mL (ref 0.35–4.50)

## 2021-08-22 ENCOUNTER — Telehealth: Payer: Self-pay | Admitting: Internal Medicine

## 2021-08-22 MED ORDER — LEVOTHYROXINE SODIUM 75 MCG PO TABS: 75.0000 ug | ORAL_TABLET | Freq: Every day | ORAL | 0 refills | Status: DC

## 2021-08-22 NOTE — Telephone Encounter (Signed)
Pt is needing a refill on levothyroxine (SYNTHROID) 75 MCG tablet  [facility] [id number] - [place], [place] - [address]

## 2021-08-22 NOTE — Telephone Encounter (Signed)
Script has been sent.

## 2021-09-19 ENCOUNTER — Ambulatory Visit: Payer: Medicaid Other | Admitting: Internal Medicine

## 2021-09-19 ENCOUNTER — Encounter: Payer: Self-pay | Admitting: Internal Medicine

## 2021-09-19 ENCOUNTER — Other Ambulatory Visit: Payer: Self-pay

## 2021-09-19 VITALS — BP 110/70 | HR 74 | Ht 60.0 in | Wt 137.8 lb

## 2021-09-19 DIAGNOSIS — E890 Postprocedural hypothyroidism: Secondary | ICD-10-CM | POA: Diagnosis not present

## 2021-09-19 DIAGNOSIS — Z85850 Personal history of malignant neoplasm of thyroid: Secondary | ICD-10-CM | POA: Diagnosis not present

## 2021-09-19 DIAGNOSIS — Z23 Encounter for immunization: Secondary | ICD-10-CM | POA: Diagnosis not present

## 2021-09-19 LAB — TSH: TSH: 1.27 u[IU]/mL (ref 0.35–5.50)

## 2021-09-19 NOTE — Progress Notes (Signed)
Name: Theresa Owens  MRN/ DOB: [id number], 04/26/1981    Age/ Sex: 40 y.o., female    PCP: Ladell Pier, MD   Reason for Endocrinology Evaluation: Postoperative hypothyroidism     Date of Initial Endocrinology Evaluation: 09/19/2021     HPI: Theresa Owens is a 40 y.o. female with a past medical history of hx of thyroid cancer. 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  No Biotin intake  LMP 08/10/2021-  She has Nexplanon    Levothyroxine 75 mcg daily      HISTORY:  Past Medical History:  Past Medical History:  Diagnosis Date   History of gestational diabetes    Normal postpartum 2 hr GTT   Hypothyroidism    Past Surgical History:  Past Surgical History:  Procedure Laterality Date   THYROIDECTOMY N/A    2006    Social History:  reports that she has never smoked. She has never used smokeless tobacco. She reports that she does not drink alcohol and does not use drugs. Family History: family history includes Hypertension in her father and mother.   HOME MEDICATIONS: Allergies as of 09/19/2021   No Known Allergies      Medication List        Accurate as of September 19, 2021  8:17 AM. If you have any questions, ask your nurse or doctor.          ferrous sulfate 325 (65 FE) MG tablet Take 1 tablet (325 mg total) by mouth daily.   levothyroxine 75 MCG tablet Commonly known as: SYNTHROID Take 1 tablet (75 mcg total) by mouth daily.   pantoprazole 20 MG tablet Commonly known as: PROTONIX Take 1 tablet (20 mg total) by mouth daily.          REVIEW OF SYSTEMS: A comprehensive ROS was conducted with the patient and is negative except as per HPI     OBJECTIVE:  VS: BP 110/70 (BP Location: Left Arm, Patient Position: Sitting, Cuff Size: Small)   Pulse 74   Ht 5' (1.524 m)   Wt 137 lb 12.8 oz (62.5 kg)   BMI 26.91 kg/m     Wt Readings from Last 3 Encounters:  03/14/21 136 lb 4 oz (61.8 kg)  01/31/21 139 lb 11.2 oz (63.4 kg)  09/20/20 135 lb 11.2 oz (61.6 kg)     EXAM: General: Pt appears well and is in NAD  Neck: General: Supple without adenopathy. Thyroid: No goiter or nodules appreciated.  Lungs: Clear with good BS bilat with no rales, rhonchi, or wheezes  Heart: Auscultation: RRR.  Abdomen: Normoactive bowel sounds, soft, nontender, without masses or organomegaly palpable  Extremities:  BL LE: No pretibial edema normal ROM and strength.  Skin: Hair: Texture and amount normal with gender appropriate distribution Skin Inspection: No rashes Skin Palpation: Skin temperature, texture, and thickness normal to palpation  Mental Status: Judgment, insight: Intact Orientation: Oriented to time, place, and person Mood and affect: No depression, anxiety, or agitation     DATA REVIEWED:   Results for TAMZIN, BERTLING (MRN [id number]) as of 08/23/2020 07:14  Ref. Range 06/14/2020 11:03  TSH Latest Ref Range: 0.450 - 4.500 uIU/mL 0.031 (L)  Triiodothyronine,Free,Serum Latest Ref Range: 2.0 - 4.4  pg/mL 3.0  T4,Free(Direct) Latest Ref Range: 0.82 - 1.77 ng/dL 1.63  THYROGLOBULIN (TG-RIA) Latest Units: ng/mL <2.0    Thyroid ultrasound 09/2020 Status post total thyroidectomy.   No thyroid bed abnormality appreciated by ultrasound. No underlying nodule or residual thyroid tissue. No regional adenopathy.   IMPRESSION: Remote thyroidectomy.  No significant finding by ultrasound.     ASSESSMENT/PLAN/RECOMMENDATIONS:   1. Postoperative hypothyroidism  - Pt is clinically euthyroid  - No local neck symptoms  - Pt educated extensively on the correct way to take levothyroxine (first thing in the morning with water, 30 minutes  before eating or taking other medications). - Pt encouraged to double dose the following day if she were to miss a dose given long half-life of levothyroxine.  Medications : Continue  levothyroxine 75 mcg daily     2. Hx of thyroid cancer:   Pt with hx of non-encapsulated sclerosing carcinoma. These records are not available through [facility] Tg , Tg Ab undetectable  Thyroid bed ultrasound shows no evidence of recurrence 09/2020   F/U in 6 months      Signed electronically by: Mack Guise, MD  [facility]  [facility] [address] [place], [place] [address] Phone: [phone] FAX: [phone]   CC: Ladell Pier, MD [place] [address] Phone: [phone] Fax: [phone]   Return to Endocrinology clinic as below: Future Appointments  Date Time Provider [place]  09/19/2021 10:30 AM Hiliana Eilts, Melanie Crazier, MD [facility] None  10/20/2021  3:15 PM Constant, Vickii Chafe, MD [facility]

## 2021-09-19 NOTE — Patient Instructions (Signed)

## 2021-09-20 LAB — THYROGLOBULIN LEVEL: Thyroglobulin: <0.1 ng/mL — ABNORMAL LOW

## 2021-09-20 LAB — THYROGLOBULIN ANTIBODY: Thyroglobulin Ab: <1 IU/mL (ref <=1)

## 2021-09-21 ENCOUNTER — Encounter: Payer: Self-pay | Admitting: Internal Medicine

## 2021-09-21 MED ORDER — LEVOTHYROXINE SODIUM 75 MCG PO TABS
75.0000 ug | ORAL_TABLET | Freq: Every day | ORAL | 3 refills | Status: DC
Start: 2021-09-21 — End: 2021-11-21

## 2021-10-20 ENCOUNTER — Ambulatory Visit (INDEPENDENT_AMBULATORY_CARE_PROVIDER_SITE_OTHER): Payer: Medicaid Other | Admitting: Obstetrics and Gynecology

## 2021-10-20 ENCOUNTER — Encounter: Payer: Self-pay | Admitting: Obstetrics and Gynecology

## 2021-10-20 ENCOUNTER — Other Ambulatory Visit: Payer: Self-pay

## 2021-10-20 VITALS — BP 134/88 | HR 71 | Wt 134.7 lb

## 2021-10-20 DIAGNOSIS — Z3046 Encounter for surveillance of implantable subdermal contraceptive: Secondary | ICD-10-CM

## 2021-10-20 NOTE — Progress Notes (Signed)
Patient here for Nexplanon removal  Patient given informed consent, signed copy in the chart, time out was performed. Pregnancy test was negative.  Removal Patient given informed consent for removal of her Nexplanon, time out was performed.  Signed copy in the chart.  Appropriate time out taken. Implanon site identified.  Area prepped in usual sterile fashon. One cc of 1% lidocaine was used to anesthetize the area at the distal end of the implant. A small stab incision was made right beside the implant on the distal portion.  The Nexplanon rod was grasped using hemostats and removed without difficulty.  There was less than 3 cc blood loss. There were no complications.  Steri-strips were applied over the small incision.  A pressure bandage was applied to reduce any bruising.  The patient tolerated the procedure well and was given post procedure instructions.  Patient with normal pap smear 01/2021. Patient does not want any birth control at this time

## 2021-11-19 ENCOUNTER — Other Ambulatory Visit: Payer: Self-pay | Admitting: Internal Medicine

## 2022-02-03 ENCOUNTER — Other Ambulatory Visit: Payer: Self-pay

## 2022-02-03 ENCOUNTER — Encounter (HOSPITAL_COMMUNITY): Payer: Self-pay

## 2022-02-03 ENCOUNTER — Ambulatory Visit (HOSPITAL_COMMUNITY)
Admission: EM | Admit: 2022-02-03 | Discharge: 2022-02-03 | Disposition: A | Discharge status: Home / Self Care | Payer: Medicaid Other | Attending: Internal Medicine | Admitting: Internal Medicine

## 2022-02-03 DIAGNOSIS — H1032 Unspecified acute conjunctivitis, left eye: Secondary | ICD-10-CM

## 2022-02-03 MED ORDER — FLUORESCEIN SODIUM 1 MG OP STRP
ORAL_STRIP | OPHTHALMIC | Status: AC
  Filled 2022-02-03: qty 1

## 2022-02-03 MED ORDER — TETRACAINE HCL 0.5 % OP SOLN
OPHTHALMIC | Status: AC
  Filled 2022-02-03: qty 4

## 2022-02-03 MED ORDER — OLOPATADINE HCL 0.1 % OP SOLN: 1.0000 [drp] | Freq: Two times a day (BID) | OPHTHALMIC | 0 refills | Status: DC

## 2022-02-03 MED ORDER — EYE WASH OPHTH SOLN
OPHTHALMIC | Status: AC
  Filled 2022-02-03: qty 118

## 2022-02-03 NOTE — Discharge Instructions (Addendum)
Please use medications as prescribed ?If you have worsening redness, worsening eye pain, eye itching, purulent discharge, blurry vision or double vision-please return to urgent care to be reevaluated. ?

## 2022-02-03 NOTE — ED Triage Notes (Signed)
Pt presents to office today for left eye redness and swelling. ?

## 2022-02-06 NOTE — ED Provider Notes (Signed)
?[facility] ? ? ? ?CSN: [id number] ?Arrival date & time: 02/03/22  1059 ? ? ?  ? ?History   ?Chief Complaint ?No chief complaint on file. ? ? ?HPI ?Theresa Owens is a 41 y.o. female comes to the urgent care with bilateral eye swelling and redness of 1 day duration.  Patient says symptoms started this morning.  She denies any trauma to the left eye.  No blurry vision or double vision.  No runny nose, sore throat or difficulty or pain swallowing.  No sick contacts.  No generalized body aches fever or chills.  No nausea vomiting or diarrhea.  Patient denies seasonal allergies.  ? ?HPI ? ?Past Medical History:  ?Diagnosis Date  ? History of gestational diabetes   ? Normal postpartum 2 hr GTT  ? Hypothyroidism   ? ? ?Patient Active Problem List  ? Diagnosis Date Noted  ? Nexplanon in place 01/31/2021  ? Postoperative hypothyroidism 08/23/2020  ? History of thyroid cancer 02/18/2018  ? Hypothyroidism, postsurgical 04/21/2016  ? Nonencapsulated sclerosing carcinoma ([place]) 04/21/2016  ? ? ?Past Surgical History:  ?Procedure Laterality Date  ? THYROIDECTOMY N/A   ? 2006  ? ? ?OB History   ? ? Gravida  ?5  ? Para  ?4  ? Term  ?4  ? Preterm  ?0  ? AB  ?1  ? Living  ?4  ?  ? ? SAB  ?1  ? IAB  ?0  ? Ectopic  ?0  ? Multiple  ?0  ? Live Births  ?4  ?   ?  ?  ? ? ? ?Home Medications   ? ?Prior to Admission medications   ?Medication Sig Start Date End Date Taking? Authorizing Provider  ?olopatadine (PATANOL) 0.1 % ophthalmic solution Place 1 drop into both eyes 2 (two) times daily. 02/03/22  Yes Janiesha Diehl, Myrene Galas, MD  ?levothyroxine (SYNTHROID) 75 MCG tablet TAKE 1 TABLET(75 MCG) BY MOUTH DAILY 11/21/21   Shamleffer, Melanie Crazier, MD  ?Multiple Vitamin (MULTIVITAMIN) tablet Take 1 tablet by mouth daily.    [provider]  ? ? ?Family History ?Family History  ?Problem Relation Age of Onset  ? Hypertension Mother   ? Hypertension Father   ? ? ?Social History ?Social History  ? ?Tobacco Use  ? Smoking status: Never  ?  Smokeless tobacco: Never  ?Vaping Use  ? Vaping Use: Never used  ?Substance Use Topics  ? Alcohol use: No  ? Drug use: No  ? ? ? ?Allergies   ?Patient has no known allergies. ? ? ?Review of Systems ?Review of Systems  ?Constitutional: Negative.   ?HENT: Negative.    ?Eyes:  Positive for redness. Negative for photophobia, discharge and visual disturbance.  ?Respiratory: Negative.    ?Cardiovascular: Negative.   ?Gastrointestinal: Negative.   ?Genitourinary: Negative.   ? ? ?Physical Exam ?Triage Vital Signs ?ED Triage Vitals  ?Enc Vitals Group  ?   BP 02/03/22 1250 (!) 156/81  ?   Pulse Rate 02/03/22 1250 61  ?   Resp 02/03/22 1250 16  ?   Temp 02/03/22 1250 98.6 ?F (37 ?C)  ?   Temp Source 02/03/22 1250 Oral  ?   SpO2 02/03/22 1250 100 %  ?   Weight --   ?   Height --   ?   Head Circumference --   ?   Peak Flow --   ?   Pain Score 02/03/22 1316 0  ?   Pain Loc --   ?  Pain Edu? --   ?   Excl. in [place]? --   ? ?No data found. ? ?Updated Vital Signs ?BP (!) 156/81 (BP Location: Left Arm)   Pulse 61   Temp 98.6 ?F (37 ?C) (Oral)   Resp 16   SpO2 100%  ? ?Visual Acuity ?Right Eye Distance:   ?Left Eye Distance:   ?Bilateral Distance:   ? ?Right Eye Near:   ?Left Eye Near:    ?Bilateral Near:    ? ?Physical Exam ?Vitals and nursing note reviewed.  ?Constitutional:   ?   General: She is not in acute distress. ?   Appearance: She is not ill-appearing.  ?HENT:  ?   Right Ear: Tympanic membrane normal.  ?   Left Ear: Tympanic membrane normal.  ?Eyes:  ?   Extraocular Movements: Extraocular movements intact.  ?   Comments: Left conjunctival erythema.  Pupils are reactive to direct and consensual light.  ?Cardiovascular:  ?   Rate and Rhythm: Normal rate and regular rhythm.  ?   Pulses: Normal pulses.  ?   Heart sounds: Normal heart sounds.  ?Pulmonary:  ?   Effort: Pulmonary effort is normal.  ?   Breath sounds: Normal breath sounds.  ?Neurological:  ?   Mental Status: She is alert.  ? ? ? ?UC Treatments / Results   ?Labs ?(all labs ordered are listed, but only abnormal results are displayed) ?Labs Reviewed - No data to display ? ?EKG ? ? ?Radiology ?No results found. ? ?Procedures ?Procedures (including critical care time) ? ?Medications Ordered in UC ?Medications - No data to display ? ?Initial Impression / Assessment and Plan / UC Course  ?I have reviewed the triage vital signs and the nursing notes. ? ?Pertinent labs & imaging results that were available during my care of the patient were reviewed by me and considered in my medical decision making (see chart for details). ? ?  ? ?1.  Acute conjunctivitis of the left eye: ?Patanol eyedrops as needed for eye itching/redness ?No blurry vision or double vision. ?Return precautions given. ?Strict hand hygiene recommended. ?Final Clinical Impressions(s) / UC Diagnoses  ? ?Final diagnoses:  ?Acute conjunctivitis of left eye, unspecified acute conjunctivitis type  ? ? ? ?Discharge Instructions   ? ?  ?Please use medications as prescribed ?If you have worsening redness, worsening eye pain, eye itching, purulent discharge, blurry vision or double vision-please return to urgent care to be reevaluated. ? ? ?ED Prescriptions   ? ? Medication Sig Dispense Auth. Provider  ? olopatadine (PATANOL) 0.1 % ophthalmic solution Place 1 drop into both eyes 2 (two) times daily. 5 mL Keionte Swicegood, Myrene Galas, MD  ? ?  ? ?PDMP not reviewed this encounter. ?  ?Chase Picket, MD ?02/06/22 1331 ? ?

## 2022-03-20 ENCOUNTER — Encounter: Payer: Self-pay | Admitting: Internal Medicine

## 2022-03-20 ENCOUNTER — Ambulatory Visit: Payer: Medicaid Other | Admitting: Internal Medicine

## 2022-03-20 VITALS — BP 110/68 | HR 74 | Ht 60.0 in | Wt 135.2 lb

## 2022-03-20 DIAGNOSIS — E890 Postprocedural hypothyroidism: Secondary | ICD-10-CM | POA: Diagnosis not present

## 2022-03-20 DIAGNOSIS — Z85850 Personal history of malignant neoplasm of thyroid: Secondary | ICD-10-CM | POA: Diagnosis not present

## 2022-03-20 LAB — TSH: TSH: 0.71 u[IU]/mL (ref 0.35–5.50)

## 2022-03-20 NOTE — Progress Notes (Signed)
? ? ?Name: Theresa Owens  ?MRN/ DOB: [id number], [birth year]    ?Age/ Sex: 41 y.o., female   ? ?PCP: Ladell Pier, MD   ?Reason for Endocrinology Evaluation: Postoperative hypothyroidism  ?   ?Date of Initial Endocrinology Evaluation: 09/19/2021  ? ? ?HPI: ?Ms. Theresa Owens is a 41 y.o. female with a past medical history of hx of thyroid cancer. The patient presented for initial endocrinology clinic visit on 09/19/2021 for consultative assistance with her postoperative hypothyroidism  ? 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 S/P RAI ablation  ? ? ?SUBJECTIVE:  ? ? ?Today (03/20/22): Ms. Theresa Owens is here for follow-up on postoperative hypothyroidism and history of thyroid cancer. ? ? ?Weight has been stable  ?Denies local neck symptoms  ?Denies constipation or diarrhea.  ?Denies palpitations  ?She has been off Nexplanon 10/2021 ? ? ?Levothyroxine 75 mcg daily  ? ? ? ? ?HISTORY:  ?Past Medical History:  ?Past Medical History:  ?Diagnosis Date  ? History of gestational diabetes   ? Normal postpartum 2 hr GTT  ? Hypothyroidism   ? ?Past Surgical History:  ?Past Surgical History:  ?Procedure Laterality Date  ? THYROIDECTOMY N/A   ? 2006  ?  ?Social History:  reports that she has never smoked. She has never used smokeless tobacco. She reports that she does not drink alcohol and does not use drugs. ?Family History: family history includes Hypertension in her father and mother. ? ? ?HOME MEDICATIONS: ?Allergies as of 03/20/2022   ?No Known Allergies ?  ? ?  ?Medication List  ?  ? ?  ? Accurate as of March 20, 2022  7:28 AM. If you have any questions, ask your nurse or doctor.  ?  ?  ? ?  ? ?levothyroxine 75 MCG tablet ?Commonly known as: SYNTHROID ?TAKE 1 TABLET(75 MCG) BY MOUTH DAILY ?  ?multivitamin tablet ?Take 1 tablet by mouth daily. ?  ?olopatadine 0.1 % ophthalmic solution ?Commonly known as: Patanol ?Place 1 drop into both eyes 2 (two) times daily. ?  ? ?  ?  ? ? ?REVIEW OF  SYSTEMS: ?A comprehensive ROS was conducted with the patient and is negative except as per HPI  ? ? ?OBJECTIVE:  ?VS: BP 110/68 (BP Location: Left Arm, Patient Position: Sitting, Cuff Size: Small)   Pulse 74   Ht 5' (1.524 m)   Wt 135 lb 3.2 oz (61.3 kg)   SpO2 98%   BMI 26.40 kg/m?  ? ? ? ? ?Wt Readings from Last 3 Encounters:  ?10/20/21 134 lb 11.2 oz (61.1 kg)  ?09/19/21 137 lb 12.8 oz (62.5 kg)  ?03/14/21 136 lb 4 oz (61.8 kg)  ? ? ? ?EXAM: ?General: Pt appears well and is in NAD  ?Neck: General: Supple without adenopathy. ?Thyroid: No goiter or nodules appreciated.  ?Lungs: Clear with good BS bilat with no rales, rhonchi, or wheezes  ?Heart: Auscultation: RRR.  ?Abdomen: Normoactive bowel sounds, soft, nontender, without masses or organomegaly palpable  ?Extremities:  ?BL LE: No pretibial edema normal ROM and strength.  ?Skin: Hair: Texture and amount normal with gender appropriate distribution ?Skin Inspection: No rashes ?Skin Palpation: Skin temperature, texture, and thickness normal to palpation  ?Mental Status: Judgment, insight: Intact ?Orientation: Oriented to time, place, and person ?Mood and affect: No depression, anxiety, or agitation  ? ? ? ?DATA REVIEWED: ?  ? Latest Reference Range & Units 03/20/22 10:38  ?TSH 0.35 - 5.50 uIU/mL 0.71  ? ?  Latest Reference Range & Units 09/19/21 11:03  ?Thyroglobulin ng/mL <0.1 (L)  ?Thyroglobulin Ab < or = 1 IU/mL <1  ? ? ? ?Thyroid ultrasound 09/2020 ?Status post total thyroidectomy. ?  ?No thyroid bed abnormality appreciated by ultrasound. No underlying ?nodule or residual thyroid tissue. No regional adenopathy. ?  ?IMPRESSION: ?Remote thyroidectomy.  No significant finding by ultrasound. ?  ? ? ?ASSESSMENT/PLAN/RECOMMENDATIONS:  ? ?1. Postoperative hypothyroidism ? ?- Pt is clinically euthyroid  ?- No local neck symptoms  ?-Pre -conception counseling was done today to increase LT-4 replacement dose by 20% a week  ? ?Medications : ?Continue  levothyroxine 75  mcg daily  ? ? ? ?2. Hx of thyroid cancer:  ? ?Pt with hx of non-encapsulated sclerosing carcinoma. ?These records are not available through [facility] ?Tg , Tg Ab undetectable in the past, pending today ?Thyroid bed ultrasound shows no evidence of recurrence 09/2020 ?-We will proceed with thyroid ultrasound ? ? ?F/U in 1 yr  ? ? ? ? ?Signed electronically by: ?Abby Nena Jordan, MD ? ?[facility]  ?[facility] ?[address] ?[place], [place] [address] ?Phone: [phone] ?FAX: [phone] ? ? ?CC: ?Ladell Pier, MD ?[place] ?[place] [address] ?Phone: [phone] ?Fax: [phone] ? ? ?Return to Endocrinology clinic as below: ?Future Appointments  ?Date Time Provider [place]  ?03/20/2022 10:10 AM Gladyes Kudo, Melanie Crazier, MD [facility] None  ?  ? ? ? ? ? ?

## 2022-03-21 ENCOUNTER — Encounter: Payer: Self-pay | Admitting: Internal Medicine

## 2022-03-22 LAB — THYROGLOBULIN LEVEL: Thyroglobulin: <0.1 ng/mL — ABNORMAL LOW

## 2022-03-22 LAB — THYROGLOBULIN ANTIBODY: Thyroglobulin Ab: <1 IU/mL (ref <=1)

## 2022-03-27 ENCOUNTER — Ambulatory Visit
Admission: RE | Admit: 2022-03-27 | Discharge: 2022-03-27 | Disposition: A | Discharge status: Home / Self Care | Payer: Medicaid Other | Source: Ambulatory Visit | Attending: Internal Medicine | Admitting: Internal Medicine

## 2022-03-27 DIAGNOSIS — E890 Postprocedural hypothyroidism: Secondary | ICD-10-CM | POA: Diagnosis not present

## 2022-03-27 DIAGNOSIS — Z85850 Personal history of malignant neoplasm of thyroid: Secondary | ICD-10-CM

## 2022-03-29 ENCOUNTER — Encounter: Payer: Self-pay | Admitting: Internal Medicine

## 2022-04-17 DIAGNOSIS — R590 Localized enlarged lymph nodes: Secondary | ICD-10-CM | POA: Diagnosis not present

## 2022-04-19 ENCOUNTER — Other Ambulatory Visit: Payer: Self-pay | Admitting: Obstetrics & Gynecology

## 2022-04-19 DIAGNOSIS — N938 Other specified abnormal uterine and vaginal bleeding: Secondary | ICD-10-CM

## 2022-10-17 ENCOUNTER — Other Ambulatory Visit: Payer: Self-pay | Admitting: Internal Medicine

## 2022-12-12 ENCOUNTER — Encounter (HOSPITAL_COMMUNITY): Payer: Self-pay

## 2022-12-12 ENCOUNTER — Ambulatory Visit (HOSPITAL_COMMUNITY)
Admission: EM | Admit: 2022-12-12 | Discharge: 2022-12-12 | Disposition: A | Discharge status: Home / Self Care | Payer: Medicaid Other | Attending: Emergency Medicine | Admitting: Emergency Medicine

## 2022-12-12 DIAGNOSIS — R1031 Right lower quadrant pain: Secondary | ICD-10-CM

## 2022-12-12 LAB — POCT URINALYSIS DIPSTICK, ED / UC
Bilirubin Urine: NEGATIVE
Glucose, UA: NEGATIVE mg/dL
Ketones, ur: NEGATIVE mg/dL
Nitrite: NEGATIVE
Protein, ur: NEGATIVE mg/dL
Specific Gravity, Urine: 1.01 (ref 1.005–1.030)
Urobilinogen, UA: 0.2 mg/dL (ref 0.0–1.0)
pH: 6 (ref 5.0–8.0)

## 2022-12-12 LAB — POC URINE PREG, ED: Preg Test, Ur: NEGATIVE

## 2022-12-12 MED ORDER — KETOROLAC TROMETHAMINE 30 MG/ML IJ SOLN
30.0000 mg | Freq: Once | INTRAMUSCULAR | Status: AC
  Administered 2022-12-12: 30 mg via INTRAMUSCULAR

## 2022-12-12 MED ORDER — KETOROLAC TROMETHAMINE 30 MG/ML IJ SOLN
INTRAMUSCULAR | Status: AC
  Filled 2022-12-12: qty 1

## 2022-12-12 NOTE — Discharge Instructions (Addendum)
I am sending the swab off to check for infection. We will call you if anything returns positive.  Please call the women's clinic to schedule an appointment for this week or next.  I recommend to take ibuprofen every 6 hours (3x daily) for the next several days. Do not take any for the rest of today. You can start tomorrow morning if needed.  Please go to the emergency department if symptoms worsen, especially with increasing pain.

## 2022-12-12 NOTE — ED Triage Notes (Signed)
Pt is here for pain on the right side since this morning

## 2022-12-12 NOTE — ED Provider Notes (Signed)
[facility]    CSN: [id number] Arrival date & time: 12/12/22  1232     History   Chief Complaint Chief Complaint  Patient presents with   right side pain    HPI Theresa Owens is a 42 y.o. female.  Presents with 7 hour history of RLQ pain Feels constant, pinching sensation. 6-8/10 Does not change with movement. No injury or trauma Does not radiate anywhere. No back or flank pain. She denies associated n/v/d/c. Last BM was today and normal.  Has been urinating well. Maybe a little dysuria today No fevers  Has not taken any medications  Denies vaginal discharge Unprotected intercourse, no known exposure to STD LMP 12/7  Denies hx of abdominal surgery  Past Medical History:  Diagnosis Date   History of gestational diabetes    Normal postpartum 2 hr GTT   Hypothyroidism     Patient Active Problem List   Diagnosis Date Noted   Nexplanon in place 01/31/2021   Postoperative hypothyroidism 08/23/2020   History of thyroid cancer 02/18/2018   Hypothyroidism, postsurgical 04/21/2016   Nonencapsulated sclerosing carcinoma ([place]) 04/21/2016    Past Surgical History:  Procedure Laterality Date   THYROIDECTOMY N/A    2006    OB History     Gravida  5   Para  4   Term  4   Preterm  0   AB  1   Living  4      SAB  1   IAB  0   Ectopic  0   Multiple  0   Live Births  4            Home Medications    Prior to Admission medications   Medication Sig Start Date End Date Taking? Authorizing Provider  levothyroxine (SYNTHROID) 75 MCG tablet TAKE 1 TABLET(75 MCG) BY MOUTH DAILY 10/17/22   Shamleffer, Melanie Crazier, MD  Multiple Vitamin (MULTIVITAMIN) tablet Take 1 tablet by mouth daily.    [provider]  olopatadine (PATANOL) 0.1 % ophthalmic solution Place 1 drop into both eyes 2 (two) times daily. Patient not taking: Reported on 03/20/2022 02/03/22   Chase Picket, MD    Family History Family History  Problem Relation Age of  Onset   Hypertension Mother    Hypertension Father     Social History Social History   Tobacco Use   Smoking status: Never   Smokeless tobacco: Never  Vaping Use   Vaping Use: Never used  Substance Use Topics   Alcohol use: No   Drug use: No     Allergies   Patient has no known allergies.   Review of Systems Review of Systems As per HPI  Physical Exam Triage Vital Signs ED Triage Vitals  Enc Vitals Group     BP 12/12/22 1442 (!) 137/95     Pulse Rate 12/12/22 1442 68     Resp 12/12/22 1442 16     Temp 12/12/22 1442 98 F (36.7 C)     Temp Source 12/12/22 1442 Oral     SpO2 12/12/22 1442 95 %     Weight --      Height --      Head Circumference --      Peak Flow --      Pain Score 12/12/22 1441 6     Pain Loc --      Pain Edu? --      Excl. in [place]? --    No  data found.  Updated Vital Signs BP (!) 137/95 (BP Location: Left Arm)   Pulse 68   Temp 98 F (36.7 C) (Oral)   Resp 16   LMP 11/09/2022   SpO2 95%   Physical Exam Vitals and nursing note reviewed.  Constitutional:      General: She is not in acute distress. HENT:     Mouth/Throat:     Mouth: Mucous membranes are moist.     Pharynx: Oropharynx is clear.  Eyes:     Conjunctiva/sclera: Conjunctivae normal.  Cardiovascular:     Rate and Rhythm: Normal rate and regular rhythm.     Heart sounds: Normal heart sounds.  Pulmonary:     Effort: Pulmonary effort is normal.     Breath sounds: Normal breath sounds.  Abdominal:     General: Bowel sounds are normal.     Palpations: Abdomen is soft.     Tenderness: There is abdominal tenderness in the right upper quadrant. There is no right CVA tenderness, left CVA tenderness, guarding or rebound. Negative signs include McBurney's sign.     Comments: Minimally tender in RLQ. There is no guarding or rebound.   Skin:    General: Skin is warm and dry.     Findings: No rash.  Neurological:     Mental Status: She is alert and oriented to person, place,  and time.     UC Treatments / Results  Labs (all labs ordered are listed, but only abnormal results are displayed) Labs Reviewed  POCT URINALYSIS DIPSTICK, ED / UC - Abnormal; Notable for the following components:      Result Value   Hgb urine dipstick TRACE (*)    Leukocytes,Ua TRACE (*)    All other components within normal limits  URINE CULTURE  POC URINE PREG, ED  CERVICOVAGINAL ANCILLARY ONLY    EKG  Radiology No results found.  Procedures Procedures  Medications Ordered in UC Medications  ketorolac (TORADOL) 30 MG/ML injection 30 mg (30 mg Intramuscular Given 12/12/22 1528)    Initial Impression / Assessment and Plan / UC Course  I have reviewed the triage vital signs and the nursing notes.  Pertinent labs & imaging results that were available during my care of the patient were reviewed by me and considered in my medical decision making (see chart for details).  UPT negative UA trace hgb, leuks. Will culture although not likely main cause of her pain. Recommend cytology swab to r/o infection. Pending.  Toradol injection given with a little improvement in pain.  Discussed use of ibuprofen 3x daily for pain control, monitoring symptoms over the next few days. I recommend she call the women's clinic to schedule appointment in case this is ovarian in etiology. Showed patient online scheduling for establishing with PCP. Discussed strict ED precautions for any worsening pain. Patient agreeable to plan  Final Clinical Impressions(s) / UC Diagnoses   Final diagnoses:  RLQ abdominal pain     Discharge Instructions      I am sending the swab off to check for infection. We will call you if anything returns positive.  Please call the women's clinic to schedule an appointment for this week or next.  I recommend to take ibuprofen every 6 hours (3x daily) for the next several days. Do not take any for the rest of today. You can start tomorrow morning if  needed.  Please go to the emergency department if symptoms worsen, especially with increasing pain.  ED Prescriptions   None    PDMP not reviewed this encounter.   Markevius Trombetta, Vernice Jefferson 12/12/22 1628

## 2022-12-13 LAB — CERVICOVAGINAL ANCILLARY ONLY
Bacterial Vaginitis (gardnerella): NEGATIVE
Candida Glabrata: NEGATIVE
Candida Vaginitis: NEGATIVE
Chlamydia: NEGATIVE
Comment: NEGATIVE
Comment: NEGATIVE
Comment: NEGATIVE
Comment: NEGATIVE
Comment: NEGATIVE
Comment: NORMAL
Neisseria Gonorrhea: NEGATIVE
Trichomonas: NEGATIVE

## 2022-12-13 LAB — URINE CULTURE

## 2022-12-20 ENCOUNTER — Ambulatory Visit (INDEPENDENT_AMBULATORY_CARE_PROVIDER_SITE_OTHER): Payer: Medicaid Other | Admitting: Nurse Practitioner

## 2022-12-20 ENCOUNTER — Encounter: Payer: Self-pay | Admitting: Nurse Practitioner

## 2022-12-20 VITALS — BP 110/68 | HR 70 | Temp 98.3°F | Ht 58.5 in | Wt 130.0 lb

## 2022-12-20 DIAGNOSIS — Z01419 Encounter for gynecological examination (general) (routine) without abnormal findings: Secondary | ICD-10-CM | POA: Diagnosis not present

## 2022-12-20 DIAGNOSIS — R1031 Right lower quadrant pain: Secondary | ICD-10-CM | POA: Diagnosis not present

## 2022-12-20 DIAGNOSIS — R102 Pelvic and perineal pain: Secondary | ICD-10-CM | POA: Diagnosis not present

## 2022-12-20 LAB — CBC WITH DIFFERENTIAL/PLATELET
Absolute Monocytes: 509 cells/uL (ref 200–950)
Basophils Absolute: 42 cells/uL (ref 0–200)
Basophils Relative: 0.4 %
Eosinophils Absolute: 127 cells/uL (ref 15–500)
Eosinophils Relative: 1.2 %
HCT: 44.2 % (ref 35.0–45.0)
Hemoglobin: 14.7 g/dL (ref 11.7–15.5)
Lymphs Abs: 3742 cells/uL (ref 850–3900)
MCH: 28.3 pg (ref 27.0–33.0)
MCHC: 33.3 g/dL (ref 32.0–36.0)
MCV: 85 fL (ref 80.0–100.0)
MPV: 12.3 fL (ref 7.5–12.5)
Monocytes Relative: 4.8 %
Neutro Abs: 6180 cells/uL (ref 1500–7800)
Neutrophils Relative %: 58.3 %
Platelets: 230 10*3/uL (ref 140–400)
RBC: 5.2 10*6/uL — ABNORMAL HIGH (ref 3.80–5.10)
RDW: 12.4 % (ref 11.0–15.0)
Total Lymphocyte: 35.3 %
WBC: 10.6 10*3/uL (ref 3.8–10.8)

## 2022-12-20 NOTE — Progress Notes (Signed)
Theresa Owens 07-12-1981 [id number]   History:  42 y.o. [id number] presents as new patient to establish care and for ER follow up. Seen 12/12/2021 in ER for RLQ abdominal pain. Negative UPT, STD screening, and UA. Urine culture inadequate due to contamination. No imaging done. Pain is slightly better but still bothersome. Described as sharp, intermittent occurring many times throughout the day, worse with activity (goes from sitting to standing a lot at work), rest and Tylenol help. Denies vaginal, urinary or GI symptom other than occasional bloating with certain foods. Monthly cycles. Normal pap history, UTD with most recent 01/2021. S/P 2006 thyroidectomy, managed by endocrinology.  Gynecologic History Patient's last menstrual period was 11/30/2022 (exact date). Period Duration (Days): 7 Period Pattern: Regular Menstrual Flow:  (regular flow) Menstrual Control: Maxi pad Dysmenorrhea: (!) Mild Dysmenorrhea Symptoms: Cramping Contraception/Family planning: none Sexually active: Yes  Health Maintenance Last Pap: 01/31/2021. 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   ROS:  A ROS was performed and pertinent positives and negatives are included.  Exam:  Vitals:   12/20/22 1048  BP: 110/68  Pulse: 70  Temp: 98.3 F (36.8 C)  TempSrc: Oral  SpO2: 99%  Weight: 130 lb (59 kg)  Height: 4' 10.5" (1.486 m)   Body mass index is 26.71 kg/m.  General appearance:  Normal Thyroid:  Symmetrical, normal in size, without palpable masses or nodularity. Respiratory  Auscultation:  Clear without wheezing or rhonchi Cardiovascular  Auscultation:  Regular rate, without rubs, murmurs or gallops  Edema/varicosities:  Not grossly evident Abdominal  Soft, tender  in RLQ, guarding present, no rebound  Liver/spleen:  No organomegaly noted  Hernia:  None appreciated  Skin  Inspection:  Grossly normal Breasts: Examined lying and sitting.   Right: Without masses, retractions, nipple discharge or axillary adenopathy.   Left: Without masses, retractions, nipple discharge or axillary adenopathy. Genitourinary   Inguinal/mons:  Normal without inguinal adenopathy  External genitalia:  Normal appearing vulva with no masses, tenderness, or lesions  BUS/Urethra/Skene's glands:  Normal  Vagina:  Normal appearing with normal color and discharge, no lesions  Cervix:  Normal appearing without discharge or lesions  Uterus:  Normal in size, shape and contour.  Midline and mobile, nontender  Adnexa/parametria:     Rt: Normal in size, without masses or tenderness.   Lt: Normal in size, without masses or tenderness.  Anus and perineum: Normal  Digital rectal exam: Not indicated  Patient informed chaperone available to be present for breast and pelvic exam. Patient has requested no chaperone to be present. Patient has been advised what will be completed during breast and pelvic exam.   UA 1+ leukocytes, negative nitrites, negative blood, negative protein, yellow/slightly cloudy. Microscopic: wbc 6-10, rbc 0-2, few bacteria  Assessment/Plan:  42 y.o. [id number] to establish care and for ER follow up.  Well female exam with routine gynecological exam - Education provided on SBEs, importance of preventative screenings, current guidelines, high calcium diet, regular exercise, and multivitamin daily.   Right lower quadrant abdominal pain - Plan: Urinalysis,Complete w/RFL Culture, CBC with Differential/Platelet, US PELVIS TRANSVAGINAL NON-OB (TV ONLY). Urine recollected due to contaminated sample in ER. Will check WBC. Schedule pelvic ultrasound.   Screening for cervical cancer - Normal Pap history.  Will repeat at 5-year interval  per guidelines.  Return in 1 year for  annual.     Tamela Gammon DNP, 11:04 AM 12/20/2022

## 2022-12-22 LAB — URINE CULTURE
MICRO NUMBER:: 14438952
Result:: NO GROWTH
SPECIMEN QUALITY:: ADEQUATE

## 2022-12-22 LAB — URINALYSIS, COMPLETE W/RFL CULTURE
Bilirubin Urine: NEGATIVE
Casts: NONE SEEN /LPF
Crystals: NONE SEEN /HPF
Glucose, UA: NEGATIVE
Hgb urine dipstick: NEGATIVE
Hyaline Cast: NONE SEEN /LPF
Ketones, ur: NEGATIVE
Nitrites, Initial: NEGATIVE
Protein, ur: NEGATIVE
Specific Gravity, Urine: 1.02 (ref 1.001–1.035)
Yeast: NONE SEEN /HPF
pH: 6.5 (ref 5.0–8.0)

## 2022-12-22 LAB — CULTURE INDICATED

## 2023-01-11 ENCOUNTER — Ambulatory Visit (INDEPENDENT_AMBULATORY_CARE_PROVIDER_SITE_OTHER): Payer: Medicaid Other

## 2023-01-11 ENCOUNTER — Encounter: Payer: Self-pay | Admitting: Nurse Practitioner

## 2023-01-11 ENCOUNTER — Ambulatory Visit (INDEPENDENT_AMBULATORY_CARE_PROVIDER_SITE_OTHER): Payer: Medicaid Other | Admitting: Nurse Practitioner

## 2023-01-11 VITALS — BP 110/64 | HR 80

## 2023-01-11 DIAGNOSIS — R1031 Right lower quadrant pain: Secondary | ICD-10-CM

## 2023-01-11 NOTE — Progress Notes (Signed)
   Acute Office Visit  Subjective:    Patient ID: Theresa Owens, female    DOB: 1981/08/22, 42 y.o.   MRN: [id number]   HPI 42 y.o. presents today for RLQ abdominal pain since early January. Pain is better but still bothersome. Described as sharp, intermittent occurring many times throughout the day, worse with activity (goes from sitting to standing a lot at work), rest and Tylenol help. Seen in ER 12/12/22 then seen here. Negative UPT, STD, UA.    Review of Systems  Constitutional: Negative.   Gastrointestinal:  Positive for abdominal pain. Negative for constipation, diarrhea, nausea and vomiting.  Genitourinary: Negative.        Objective:    Physical Exam Constitutional:      Appearance: Normal appearance.     BP 110/64   Pulse 80   LMP 12/25/2022 (Exact Date)   SpO2 99%  Wt Readings from Last 3 Encounters:  12/20/22 130 lb (59 kg)  03/20/22 135 lb 3.2 oz (61.3 kg)  10/20/21 134 lb 11.2 oz (61.1 kg)         Assessment & Plan:   Problem List Items Addressed This Visit   None Visit Diagnoses     Right lower quadrant abdominal pain    -  Primary      Vaginal ultrasound: Retroverted uterus, normal size and shape, inhomogenous myometrium with cystic areas and streaky shadowing suggestive of adenomyosis.  Single intramural fibroid 1.7 cm.  Symmetrical endometrium 3.4 mm.  No masses or thickening seen.  Both ovaries mobile, normal size with normal follicle pattern and normal perfusion.  No adnexal masses, no free fluid.  Plan: Normal ultrasound thoroughly reviewed with patient. Pain not GYN-related. Recommend following up with PCP and she is agreeable.      Tamela Gammon DNP, 2:14 PM 01/11/2023

## 2023-03-26 ENCOUNTER — Ambulatory Visit: Payer: Medicaid Other | Admitting: Internal Medicine

## 2023-03-26 ENCOUNTER — Encounter: Payer: Self-pay | Admitting: Internal Medicine

## 2023-03-26 VITALS — BP 110/74 | HR 70 | Ht 58.5 in | Wt 136.8 lb

## 2023-03-26 DIAGNOSIS — E890 Postprocedural hypothyroidism: Secondary | ICD-10-CM

## 2023-03-26 DIAGNOSIS — Z85850 Personal history of malignant neoplasm of thyroid: Secondary | ICD-10-CM

## 2023-03-26 LAB — TSH: TSH: 1.74 u[IU]/mL (ref 0.35–5.50)

## 2023-03-26 MED ORDER — LEVOTHYROXINE SODIUM 75 MCG PO TABS: 75.0000 ug | ORAL_TABLET | Freq: Every day | ORAL | 3 refills | Status: DC

## 2023-03-26 NOTE — Progress Notes (Signed)
Name: Theresa Owens  MRN/ DOB: [id number], August 01, 1981    Age/ Sex: 42 y.o., female    PCP: Marcine Matar, MD   Reason for Endocrinology Evaluation: Postoperative hypothyroidism     Date of Initial Endocrinology Evaluation: 09/19/2021    HPI: Theresa Owens is a 42 y.o. female with a past medical history of hx of thyroid cancer. 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   Denies local neck symptoms  Has palpitations when she is nervous  Denies tremors  Denies diarrhea.  She is not on COC's  LMP 4/13th, regular    Levothyroxine 75 mcg daily      HISTORY:  Past Medical History:  Past Medical History:  Diagnosis Date   Heart murmur    History of gestational diabetes    Normal postpartum 2 hr GTT   Hypothyroidism    Past Surgical History:  Past Surgical History:  Procedure Laterality Date   THYROIDECTOMY N/A    2006    Social History:  reports that she has never smoked. She has never used smokeless tobacco. She reports that she does not drink alcohol and does not use drugs. Family History: family history includes Hypertension in her father and mother.   HOME MEDICATIONS: Allergies as of 03/26/2023   No Known Allergies      Medication List        Accurate as of March 26, 2023  7:37 AM. If you have any questions, ask your nurse or doctor.          levothyroxine 75 MCG tablet Commonly known as: SYNTHROID TAKE 1 TABLET(75 MCG) BY MOUTH DAILY   multivitamin tablet Take 1 tablet by mouth daily.          REVIEW OF SYSTEMS: A comprehensive ROS was conducted with the patient and is negative except as per HPI     OBJECTIVE:  VS: There were no vitals taken for this visit.     Wt Readings from Last 3 Encounters:  12/20/22 130 lb (59 kg)  03/20/22 135 lb 3.2 oz (61.3 kg)  10/20/21 134 lb 11.2 oz (61.1 kg)     EXAM: General: Pt appears well and is in NAD  Neck: General: Supple without adenopathy. Thyroid: No goiter or nodules appreciated.  Lungs: Clear with good BS bilat with no rales, rhonchi, or wheezes  Heart: Auscultation: RRR.  Abdomen: Normoactive bowel sounds, soft, nontender, without masses or organomegaly palpable  Extremities:  BL LE: No pretibial edema normal ROM and strength.  Skin: Hair: Texture and amount normal with gender appropriate distribution Skin Inspection: No rashes Skin Palpation: Skin temperature, texture, and thickness normal to palpation  Mental Status: Judgment, insight: Intact Orientation: Oriented to time, place, and person Mood and affect: No depression, anxiety, or agitation     DATA REVIEWED:    Latest Reference Range & Units 03/26/23 10:40  TSH 0.35 - 5.50 uIU/mL 1.74     Latest Reference Range & Units 09/19/21 11:03  Thyroglobulin ng/mL <0.1 (L)  Thyroglobulin Ab < or = 1 IU/mL <1     Thyroid ultrasound 03/27/2022  FINDINGS: Postsurgical changes after total thyroidectomy. No soft tissue in the thyroid bed. No cervical lymphadenopathy.  IMPRESSION: Status post total thyroidectomy without evidence of local recurrence or regional lymphadenopathy.   ASSESSMENT/PLAN/RECOMMENDATIONS:   1. Postoperative hypothyroidism  - Pt is clinically euthyroid  - No local neck symptoms  -Pre -conception counseling was done today to increase LT-4 replacement dose by 20% a week   Medications : Continue  levothyroxine 75 mcg daily     2. Hx of thyroid cancer:   - Pt with hx of non-encapsulated sclerosing carcinoma. -These records are not available through [facility] -Historically she has had undetectable thyroglobulin, and thyroglobulin antibodies,  these are pending on today's labs -Thyroid bed ultrasound shows no evidence of recurrence 03/2022    F/U in 1 yr      Signed electronically by: Lyndle Herrlich, MD  [facility]  [facility] [address] [place] [place], [place] [address] Phone: [phone] FAX: [phone]   CC: Marcine Matar, MD [address] [place] [address] [place] [address] Phone: [phone] Fax: [phone]   Return to Endocrinology clinic as below: Future Appointments  Date Time Provider Department Center  03/26/2023 10:10 AM Elesia Pemberton, Konrad Dolores, MD [facility] None

## 2023-03-28 LAB — THYROGLOBULIN ANTIBODY: Thyroglobulin Ab: <1 IU/mL (ref <=1)

## 2023-03-28 LAB — THYROGLOBULIN LEVEL: Thyroglobulin: 0.1 ng/mL — ABNORMAL LOW

## 2023-03-29 ENCOUNTER — Encounter: Payer: Self-pay | Admitting: Internal Medicine

## 2024-03-27 ENCOUNTER — Other Ambulatory Visit: Payer: Self-pay | Admitting: Internal Medicine

## 2024-03-31 ENCOUNTER — Encounter: Payer: Self-pay | Admitting: Internal Medicine

## 2024-03-31 ENCOUNTER — Ambulatory Visit: Payer: Medicaid Other | Admitting: Internal Medicine

## 2024-03-31 VITALS — BP 110/70 | HR 75 | Ht 58.5 in | Wt 145.6 lb

## 2024-03-31 DIAGNOSIS — Z85850 Personal history of malignant neoplasm of thyroid: Secondary | ICD-10-CM | POA: Diagnosis not present

## 2024-03-31 DIAGNOSIS — E890 Postprocedural hypothyroidism: Secondary | ICD-10-CM | POA: Diagnosis not present

## 2024-03-31 NOTE — Progress Notes (Unsigned)
 Name: Theresa Owens  MRN/ DOB: [id number], 11-23-1981    Age/ Sex: 43 y.o., female    PCP: Lawrance Presume, MD   Reason for Endocrinology Evaluation: Postoperative hypothyroidism     Date of Initial Endocrinology Evaluation: 09/19/2021    HPI: Theresa Owens is a 43 y.o. female with a past medical history of hx of thyroid  cancer. 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 is here for follow-up on postoperative hypothyroidism and history of thyroid  cancer.   Denies local neck symptoms  Denies palpitations  Denies tremors  Denies diarrhea or constipation  Takes Mvi at lunch  Levothyroxine  75 mcg daily      HISTORY:  Past Medical History:  Past Medical History:  Diagnosis Date   Heart murmur    History of gestational diabetes    Normal postpartum 2 hr GTT   Hypothyroidism    Past Surgical History:  Past Surgical History:  Procedure Laterality Date   THYROIDECTOMY N/A    2006    Social History:  reports that she has never smoked. She has never used smokeless tobacco. She reports that she does not drink alcohol and does not use drugs. Family History: family history includes Hypertension in her father and mother.   HOME MEDICATIONS: Allergies as of 03/31/2024   No Known Allergies      Medication List        Accurate as of March 31, 2024 10:05 AM. If you have any questions, ask your nurse or doctor.          levothyroxine  75 MCG tablet Commonly known as: SYNTHROID  TAKE 1 TABLET(75 MCG) BY MOUTH DAILY BEFORE BREAKFAST   multivitamin tablet Take 1 tablet by mouth daily.          REVIEW OF SYSTEMS: A comprehensive ROS was conducted with the patient and is negative except as per HPI    OBJECTIVE:   VS: BP 110/70 (BP Location: Left Arm, Patient Position: Sitting, Cuff Size: Normal)   Pulse 75   Ht 4' 10.5" (1.486 m)   Wt 145 lb 9.6 oz (66 kg)   SpO2 99%   BMI 29.91 kg/m      Wt Readings from Last 3 Encounters:  03/31/24 145 lb 9.6 oz (66 kg)  03/26/23 136 lb 12.8 oz (62.1 kg)  12/20/22 130 lb (59 kg)     EXAM: General: Pt appears well and is in NAD  Neck: General: Supple without adenopathy. Thyroid : No goiter or nodules appreciated.  Lungs: Clear with good BS bilat   Heart: Auscultation: RRR.  Abdomen:  soft, nontender  Extremities:  BL LE: No pretibial edema   Mental Status: Judgment, insight: Intact Orientation: Oriented to time, place, and person Mood and affect: No depression, anxiety, or agitation     DATA REVIEWED:    Latest Reference Range & Units 03/31/24 10:12  TSH mIU/L 4.29     Latest Reference Range & Units 09/19/21 11:03  Thyroglobulin ng/mL <0.1 (L)  Thyroglobulin Ab < or = 1 IU/mL <1     Thyroid  ultrasound 03/27/2022  FINDINGS: Postsurgical changes after total thyroidectomy. No soft tissue in the thyroid  bed. No cervical lymphadenopathy.   IMPRESSION: Status post total thyroidectomy without evidence of local recurrence or regional lymphadenopathy.  ASSESSMENT/PLAN/RECOMMENDATIONS:   1. Postoperative hypothyroidism  - Pt is clinically euthyroid  - No local neck symptoms  - TSH has trended up, given history of thyroid  cancer TSH goal 0.5-2.0 uIU/mL - Will increase levothyroxine  as below  Medications : Stop levothyroxine  75 mcg daily  Start levothyroxine  88 mcg daily    2. Hx of thyroid  cancer:   - Pt with hx of non-encapsulated sclerosing carcinoma. -These records are not available through [facility] -Historically she has had undetectable thyroglobulin, and thyroglobulin antibodies, these are pending on today's labs -Thyroid  bed ultrasound shows no evidence of recurrence 03/2022    F/U in 1 yr      Signed electronically  by: Natale Bail, MD  [facility] End[facility]  [facility] [address] [place] [place], [place] [address] Phone: [phone] FAX: [phone]   CC: Lawrance Presume, MD [address] [place] [address] [place] [address] Phone: [phone] Fax: [phone]   Return to Endocrinology clinic as below: Future Appointments  Date Time Provider Department Center  03/31/2024 10:10 AM Isis Costanza, Julian Obey, MD [facility] None

## 2024-04-01 ENCOUNTER — Telehealth: Payer: Self-pay | Admitting: Internal Medicine

## 2024-04-01 LAB — THYROGLOBULIN ANTIBODY: Thyroglobulin Ab: <1 [IU]/mL (ref <=1)

## 2024-04-01 LAB — TSH: TSH: 4.29 m[IU]/L

## 2024-04-01 LAB — THYROGLOBULIN LEVEL: Thyroglobulin: 0.1 ng/mL — ABNORMAL LOW

## 2024-04-01 MED ORDER — LEVOTHYROXINE SODIUM 88 MCG PO TABS: 88.0000 ug | ORAL_TABLET | Freq: Every day | ORAL | 3 refills | Status: AC

## 2024-04-01 NOTE — Telephone Encounter (Signed)
 Please let the patient know that her thyroid  test shows that she is not on enough levothyroxine    Please stop levothyroxine  75 mcg and start the new prescription of levothyroxine  88 mcg daily      Thanks

## 2024-04-02 NOTE — Telephone Encounter (Signed)
Patient notified and will start new dose.

## 2025-04-06 ENCOUNTER — Ambulatory Visit: Admitting: Internal Medicine
